# Patient Record
Sex: Female | Born: 1990
Health system: Southern US, Community
[De-identification: ages and names within clinical notes are randomized; demographics above are authoritative.]

## PROBLEM LIST (undated history)

## (undated) ENCOUNTER — Inpatient Hospital Stay (HOSPITAL_COMMUNITY): Payer: Self-pay

## (undated) DIAGNOSIS — O0930 Supervision of pregnancy with insufficient antenatal care, unspecified trimester: Secondary | ICD-10-CM

## (undated) DIAGNOSIS — R87629 Unspecified abnormal cytological findings in specimens from vagina: Secondary | ICD-10-CM

## (undated) DIAGNOSIS — Z789 Other specified health status: Secondary | ICD-10-CM

## (undated) HISTORY — PX: WISDOM TOOTH EXTRACTION: SHX21

## (undated) HISTORY — DX: Supervision of pregnancy with insufficient antenatal care, unspecified trimester: O09.30

## (undated) HISTORY — DX: Unspecified abnormal cytological findings in specimens from vagina: R87.629

## (undated) HISTORY — PX: TONSILLECTOMY: SUR1361

---

## 2014-05-06 ENCOUNTER — Emergency Department (HOSPITAL_BASED_OUTPATIENT_CLINIC_OR_DEPARTMENT_OTHER)
Admission: EM | Admit: 2014-05-06 | Discharge: 2014-05-06 | Disposition: A | Discharge status: Home / Self Care | Payer: BLUE CROSS/BLUE SHIELD | Attending: Emergency Medicine | Admitting: Emergency Medicine

## 2014-05-06 ENCOUNTER — Encounter (HOSPITAL_BASED_OUTPATIENT_CLINIC_OR_DEPARTMENT_OTHER): Payer: Self-pay

## 2014-05-06 DIAGNOSIS — N946 Dysmenorrhea, unspecified: Secondary | ICD-10-CM | POA: Diagnosis not present

## 2014-05-06 DIAGNOSIS — R109 Unspecified abdominal pain: Secondary | ICD-10-CM | POA: Diagnosis present

## 2014-05-06 DIAGNOSIS — Z3202 Encounter for pregnancy test, result negative: Secondary | ICD-10-CM | POA: Insufficient documentation

## 2014-05-06 LAB — URINALYSIS, ROUTINE W REFLEX MICROSCOPIC
Bilirubin Urine: NEGATIVE
GLUCOSE, UA: NEGATIVE mg/dL
KETONES UR: NEGATIVE mg/dL
Leukocytes, UA: NEGATIVE
Nitrite: NEGATIVE
PROTEIN: NEGATIVE mg/dL
Specific Gravity, Urine: 1.026 (ref 1.005–1.030)
UROBILINOGEN UA: 0.2 mg/dL (ref 0.0–1.0)
pH: 7 (ref 5.0–8.0)

## 2014-05-06 LAB — URINE MICROSCOPIC-ADD ON

## 2014-05-06 LAB — PREGNANCY, URINE: PREG TEST UR: NEGATIVE

## 2014-05-06 MED ORDER — KETOROLAC TROMETHAMINE 60 MG/2ML IM SOLN
60.0000 mg | Freq: Once | INTRAMUSCULAR | Status: AC
  Administered 2014-05-06: 60 mg via INTRAMUSCULAR
  Filled 2014-05-06: qty 2

## 2014-05-06 MED ORDER — MEFENAMIC ACID 250 MG PO CAPS: 250.0000 mg | ORAL_CAPSULE | Freq: Four times a day (QID) | ORAL | Status: DC | PRN

## 2014-05-06 NOTE — ED Provider Notes (Signed)
CSN: [id number]     Arrival date & time 05/06/14  1854 History   First MD Initiated Contact with Patient 05/06/14 2041     Chief Complaint  Patient presents with  . Abdominal Pain     (Consider location/radiation/quality/duration/timing/severity/associated sxs/prior Treatment) HPI  24 year old G0 P0 presents today saying that she began having her menstrual cycle yesterday and has been having some lower abdominal cramping since that time. She describes it as severe in nature. Her period is heavier today than it has been in the past area she's describes using a pad every 3 hours today which is the second day of her menstrual cycle. She is on birth control patches and states that her period has been lighter over the past 2 years or she has been on this. She denies any abnormal vaginal discharge, lightheadedness, or history of STDs.  History reviewed. No pertinent past medical history. Past Surgical History  Procedure Laterality Date  . Tonsillectomy     No family history on file. History  Substance Use Topics  . Smoking status: Never Smoker   . Smokeless tobacco: Not on file  . Alcohol Use: No   OB History    No data available     Review of Systems  All other systems reviewed and are negative.     Allergies  Review of patient's allergies indicates no known allergies.  Home Medications   Prior to Admission medications   Medication Sig Start Date End Date Taking? Authorizing Provider  UNKNOWN TO PATIENT    Yes Historical Provider, MD   BP 135/76 mmHg  Pulse 66  Temp(Src) 97.8 F (36.6 C) (Oral)  Resp 18  Ht 5\' 6"  (1.676 m)  Wt 170 lb (77.111 kg)  BMI 27.45 kg/m2  SpO2 100%  LMP 05/06/2014 Physical Exam  Constitutional: She is oriented to person, place, and time. She appears well-developed and well-nourished.  HENT:  Head: Normocephalic and atraumatic.  Right Ear: External ear normal.  Left Ear: External ear normal.  Nose: Nose normal.  Mouth/Throat: Oropharynx  is clear and moist.  Eyes: Conjunctivae and EOM are normal. Pupils are equal, round, and reactive to light.  Neck: Normal range of motion. Neck supple.  Cardiovascular: Normal rate, regular rhythm, normal heart sounds and intact distal pulses.   Pulmonary/Chest: Effort normal and breath sounds normal.  Abdominal: Soft. Bowel sounds are normal.  Musculoskeletal: Normal range of motion.  Neurological: She is alert and oriented to person, place, and time. She has normal reflexes.  Skin: Skin is warm and dry.  Psychiatric: She has a normal mood and affect. Her behavior is normal. Judgment and thought content normal.  Nursing note and vitals reviewed.   ED Course  Procedures (including critical care time) Labs Review Labs Reviewed  URINALYSIS, ROUTINE W REFLEX MICROSCOPIC - Abnormal; Notable for the following:    Hgb urine dipstick LARGE (*)    All other components within normal limits  PREGNANCY, URINE  URINE MICROSCOPIC-ADD ON  Patient is menstruating and did clean catch urine  Imaging Review No results found.   EKG Interpretation None      MDM   Final diagnoses:  Dysmenorrhea        Margarita Grizzleanielle Wade Asebedo, MD 05/06/14 2053

## 2014-05-06 NOTE — Discharge Instructions (Signed)

## 2014-05-06 NOTE — ED Notes (Signed)
Abdominal cramping and back spasms since yesterday unrelieved with Midol.

## 2014-05-06 NOTE — ED Notes (Signed)
C/o abd cramping and back pain x 2 days  Denies urinary sx or vag dc

## 2015-08-28 ENCOUNTER — Emergency Department (HOSPITAL_BASED_OUTPATIENT_CLINIC_OR_DEPARTMENT_OTHER): Payer: BLUE CROSS/BLUE SHIELD

## 2015-08-28 ENCOUNTER — Emergency Department (HOSPITAL_BASED_OUTPATIENT_CLINIC_OR_DEPARTMENT_OTHER)
Admission: EM | Admit: 2015-08-28 | Discharge: 2015-08-28 | Disposition: A | Discharge status: Home / Self Care | Payer: BLUE CROSS/BLUE SHIELD | Attending: Emergency Medicine | Admitting: Emergency Medicine

## 2015-08-28 ENCOUNTER — Encounter (HOSPITAL_BASED_OUTPATIENT_CLINIC_OR_DEPARTMENT_OTHER): Payer: Self-pay | Admitting: Emergency Medicine

## 2015-08-28 DIAGNOSIS — R1031 Right lower quadrant pain: Secondary | ICD-10-CM

## 2015-08-28 DIAGNOSIS — R112 Nausea with vomiting, unspecified: Secondary | ICD-10-CM | POA: Insufficient documentation

## 2015-08-28 LAB — COMPREHENSIVE METABOLIC PANEL
ALBUMIN: 4.3 g/dL (ref 3.5–5.0)
ALT: 36 U/L (ref 14–54)
AST: 34 U/L (ref 15–41)
Alkaline Phosphatase: 50 U/L (ref 38–126)
Anion gap: 9 (ref 5–15)
BUN: 15 mg/dL (ref 6–20)
CO2: 23 mmol/L (ref 22–32)
CREATININE: 0.81 mg/dL (ref 0.44–1.00)
Calcium: 9.1 mg/dL (ref 8.9–10.3)
Chloride: 104 mmol/L (ref 101–111)
GFR calc Af Amer: >60 mL/min (ref >60)
GFR calc non Af Amer: >60 mL/min (ref >60)
GLUCOSE: 82 mg/dL (ref 65–99)
Potassium: 3.5 mmol/L (ref 3.5–5.1)
Sodium: 136 mmol/L (ref 135–145)
TOTAL PROTEIN: 8.1 g/dL (ref 6.5–8.1)
Total Bilirubin: 0.4 mg/dL (ref 0.3–1.2)

## 2015-08-28 LAB — CBC
HCT: 38.4 % (ref 36.0–46.0)
Hemoglobin: 13.2 g/dL (ref 12.0–15.0)
MCH: 29.2 pg (ref 26.0–34.0)
MCHC: 34.4 g/dL (ref 30.0–36.0)
MCV: 85 fL (ref 78.0–100.0)
Platelets: 270 10*3/uL (ref 150–400)
RBC: 4.52 MIL/uL (ref 3.87–5.11)
RDW: 13.7 % (ref 11.5–15.5)
WBC: 5.8 10*3/uL (ref 4.0–10.5)

## 2015-08-28 LAB — URINALYSIS, ROUTINE W REFLEX MICROSCOPIC
Bilirubin Urine: NEGATIVE
Glucose, UA: NEGATIVE mg/dL
Hgb urine dipstick: NEGATIVE
Ketones, ur: NEGATIVE mg/dL
NITRITE: NEGATIVE
Protein, ur: NEGATIVE mg/dL
SPECIFIC GRAVITY, URINE: 1.024 (ref 1.005–1.030)
pH: 6 (ref 5.0–8.0)

## 2015-08-28 LAB — URINE MICROSCOPIC-ADD ON: RBC / HPF: NONE SEEN RBC/hpf (ref 0–5)

## 2015-08-28 LAB — PREGNANCY, URINE: Preg Test, Ur: NEGATIVE

## 2015-08-28 LAB — WET PREP, GENITAL
Trich, Wet Prep: NONE SEEN
YEAST WET PREP: NONE SEEN

## 2015-08-28 LAB — LIPASE, BLOOD: LIPASE: 20 U/L (ref 11–51)

## 2015-08-28 MED ORDER — ONDANSETRON HCL 4 MG/2ML IJ SOLN
4.0000 mg | Freq: Once | INTRAMUSCULAR | Status: AC
  Administered 2015-08-28: 4 mg via INTRAVENOUS
  Filled 2015-08-28: qty 2

## 2015-08-28 MED ORDER — IOPAMIDOL (ISOVUE-300) INJECTION 61%
100.0000 mL | Freq: Once | INTRAVENOUS | Status: AC | PRN
  Administered 2015-08-28: 100 mL via INTRAVENOUS

## 2015-08-28 MED ORDER — SODIUM CHLORIDE 0.9 % IV BOLUS (SEPSIS)
1000.0000 mL | Freq: Once | INTRAVENOUS | Status: AC
  Administered 2015-08-28: 1000 mL via INTRAVENOUS

## 2015-08-28 MED ORDER — METRONIDAZOLE 500 MG PO TABS: 500.0000 mg | ORAL_TABLET | Freq: Two times a day (BID) | ORAL | Status: DC

## 2015-08-28 MED ORDER — ONDANSETRON HCL 4 MG PO TABS: 4.0000 mg | ORAL_TABLET | Freq: Four times a day (QID) | ORAL | Status: DC

## 2015-08-28 NOTE — Discharge Instructions (Signed)
Ms. Kristina PlummerKyeria Ortiz,  Nice meeting you! Please follow-up with your primary care provider. Return to the emergency department if you develop increased abdominal pain, inability to keep foods down, new/worsening symptoms. Feel better soon!  S. Lane HackerNicole Brihanna Devenport, PA-C

## 2015-08-28 NOTE — ED Provider Notes (Signed)
CSN: [id number]     Arrival date & time 08/28/15  1916 History  By signing my name below, I, Soijett Blue, attest that this documentation has been prepared under the direction and in the presence of S. Lane Hacker, PA-C Electronically Signed: Soijett Blue, ED Scribe. 08/28/2015. 7:40 PM.    Chief Complaint  Patient presents with  . Emesis      The history is provided by the patient. No language interpreter was used.    HPI Comments: Kristina Ortiz is a 25 y.o. female who presents to the Emergency Department complaining of vomiting x 2 episodes onset this morning. Pt notes that she had another vomiting episode x 1 while at work today. Pt states that she was using the patch as her contraceptive and she has been off it for 3 weeks. Denies recent abx use, undercooked food, or recent travel. Pt notes that her mother had a stomach virus recently and she has been around her mother. She states that she is having associated symptoms of subjective fever, 5/10 intermittent right lower abdominal pain x 3 days, intermittent diarrhea x 1 week and white vaginal discharge. Pt reports that she spoke with her OB-GYN about her white discharge and was informed that it was nl and there were no concerns at the time. She states that she has not tried any medications for the relief for her symptoms. She denies vaginal bleeding, constipation, nausea, and any other symptoms. Denies abdominal surgeries.   History reviewed. No pertinent past medical history. Past Surgical History  Procedure Laterality Date  . Tonsillectomy     No family history on file. Social History  Substance Use Topics  . Smoking status: Never Smoker   . Smokeless tobacco: None  . Alcohol Use: No   OB History    No data available     Review of Systems  A complete 10 system review of systems was obtained and all systems are negative except as noted in the HPI and PMH.   Allergies  Review of patient's allergies indicates no known  allergies.  Home Medications   Prior to Admission medications   Medication Sig Start Date End Date Taking? Authorizing Provider  Mefenamic Acid 250 MG CAPS Take 1 capsule (250 mg total) by mouth every 6 (six) hours as needed. 05/06/14   Margarita Grizzle, MD  UNKNOWN TO PATIENT     Historical Provider, MD   BP 138/95 mmHg  Pulse 73  Temp(Src) 98.8 F (37.1 C) (Oral)  Resp 18  Ht  (1.676 m)  Wt 170 lb (77.111 kg)  BMI 27.45 kg/m2  SpO2 100%  LMP 08/22/2015 (Approximate) Physical Exam  Constitutional: She is oriented to person, place, and time. She appears well-developed and well-nourished. No distress.  HENT:  Head: Normocephalic and atraumatic.  Eyes: EOM are normal.  Neck: Neck supple.  Cardiovascular: Normal rate.   Pulmonary/Chest: Effort normal. No respiratory distress.  Abdominal: Soft. She exhibits no distension. There is tenderness in the right lower quadrant. There is no CVA tenderness.  RLQ tenderness. +Rosvings sign. No CVA tenderness.   Genitourinary:  Erythematous cervical lesions.   Musculoskeletal: Normal range of motion.  Neurological: She is alert and oriented to person, place, and time.  Skin: Skin is warm and dry.  Psychiatric: She has a normal mood and affect. Her behavior is normal.  Nursing note and vitals reviewed.   ED Course  Procedures (including critical care time) DIAGNOSTIC STUDIES: Oxygen Saturation is 100% on RA, nl by  my interpretation.    COORDINATION OF CARE: 7:38 PM Discussed treatment plan with pt at bedside which includes UA, pelvic exam, CT abdomen pelvis, labs, metronidazole Rx, zofran, and pt agreed to plan.    Labs Review Labs Reviewed  WET PREP, GENITAL - Abnormal; Notable for the following:    Clue Cells Wet Prep HPF POC PRESENT (*)    WBC, Wet Prep HPF POC MANY (*)    All other components within normal limits  URINALYSIS, ROUTINE W REFLEX MICROSCOPIC (NOT AT [facility]) - Abnormal; Notable for the following:    Leukocytes, UA  TRACE (*)    All other components within normal limits  URINE MICROSCOPIC-ADD ON - Abnormal; Notable for the following:    Squamous Epithelial / LPF 0-5 (*)    Bacteria, UA MANY (*)    All other components within normal limits  PREGNANCY, URINE  COMPREHENSIVE METABOLIC PANEL  CBC  LIPASE, BLOOD  RPR  HIV ANTIBODY (ROUTINE TESTING)  GC/CHLAMYDIA PROBE AMP ([place]) NOT AT [facility]    Imaging Review Ct Abdomen Pelvis W Contrast  08/28/2015  CLINICAL DATA:  Diarrhea for 2 days. Right lower quadrant pain and vomiting today. EXAM: CT ABDOMEN AND PELVIS WITH CONTRAST TECHNIQUE: Multidetector CT imaging of the abdomen and pelvis was performed using the standard protocol following bolus administration of intravenous contrast. CONTRAST:  100mL ISOVUE-300 IOPAMIDOL (ISOVUE-300) INJECTION 61% COMPARISON:  None. FINDINGS: Lung bases are clear. The liver, spleen, gallbladder, pancreas, adrenal glands, kidneys, abdominal aorta, inferior vena cava, and retroperitoneal lymph nodes are unremarkable. Stomach, small bowel, and colon are not abnormally distended. Scattered stool in the colon. No free air or free fluid in the abdomen. Pelvis: The appendix is normal. Uterus and ovaries are not enlarged. No free or loculated pelvic fluid collections. No pelvic mass or lymphadenopathy. Bladder wall is not thickened. No destructive bone lesions. IMPRESSION: No acute process demonstrated in the abdomen or pelvis. Normal appendix. No evidence of bowel obstruction or inflammation. Electronically Signed   By: Burman NievesWilliam  Stevens M.D.   On: 08/28/2015 22:00   I have personally reviewed and evaluated these images and lab results as part of my medical decision-making.   MDM   Final diagnoses:  Nausea and vomiting, vomiting of unspecified type  RLQ abdominal pain   Patient nontoxic appearing, VSS. CT abdomen/pelvis unremarkable. Lipase, CBC, CMP unremarkable. BV on wet prep. Sent GC/chlamdyia cultures. Patient does not want  empiric treatment for STI until results are back. Patient may be safely discharged home. Discussed reasons for return. Patient to follow-up with GYN provider for cervical lesions. Patient in understanding and agreement with the plan.  I personally performed the services described in this documentation, which was scribed in my presence. The recorded information has been reviewed and is accurate.   Melton KrebsSamantha Nicole Evelen Vazguez, PA-C 09/11/15 1341  Loren Raceravid Yelverton, MD 09/24/15 [phone]

## 2015-08-28 NOTE — ED Notes (Signed)
Pt c/o diarrhea for the last two days and RLQ pain with vomiting today.  She is tender in RLQ and when palpated in LLQ, she hurts in RLQ.  Pt denies fever, denies sick contacts.  Also has small amount of white vaginal discharge, no concerns for STDs, had a full, 7 day menstrual period on 08/22/15.

## 2015-08-28 NOTE — ED Notes (Signed)
Returned from CT.

## 2015-08-28 NOTE — ED Notes (Signed)
Pt reports stopping birth control x 3 weeks ago.

## 2015-08-28 NOTE — ED Notes (Signed)
Pt c/o vomiting x 3 episodes today and c/o right flank pain.  Pt also reports low grade fever today.

## 2015-08-28 NOTE — ED Notes (Signed)
Patient transported to CT 

## 2015-08-29 LAB — GC/CHLAMYDIA PROBE AMP (~~LOC~~) NOT AT ARMC
Chlamydia: POSITIVE — AB
NEISSERIA GONORRHEA: NEGATIVE

## 2015-08-30 ENCOUNTER — Telehealth (HOSPITAL_BASED_OUTPATIENT_CLINIC_OR_DEPARTMENT_OTHER): Payer: Self-pay | Admitting: Emergency Medicine

## 2015-08-30 LAB — RPR: RPR: NONREACTIVE

## 2015-08-30 LAB — HIV ANTIBODY (ROUTINE TESTING W REFLEX): HIV Screen 4th Generation wRfx: NONREACTIVE

## 2015-08-30 NOTE — Telephone Encounter (Signed)
Chart handoff to EDP for treatment plan for + Chlamydia 

## 2015-09-01 ENCOUNTER — Encounter (HOSPITAL_BASED_OUTPATIENT_CLINIC_OR_DEPARTMENT_OTHER): Payer: Self-pay | Admitting: *Deleted

## 2015-09-01 ENCOUNTER — Telehealth: Payer: Self-pay | Admitting: *Deleted

## 2015-09-01 DIAGNOSIS — N730 Acute parametritis and pelvic cellulitis: Secondary | ICD-10-CM | POA: Diagnosis not present

## 2015-09-01 DIAGNOSIS — R102 Pelvic and perineal pain: Secondary | ICD-10-CM | POA: Diagnosis present

## 2015-09-01 LAB — URINALYSIS, ROUTINE W REFLEX MICROSCOPIC
BILIRUBIN URINE: NEGATIVE
GLUCOSE, UA: NEGATIVE mg/dL
Hgb urine dipstick: NEGATIVE
Ketones, ur: NEGATIVE mg/dL
Leukocytes, UA: NEGATIVE
Nitrite: NEGATIVE
Protein, ur: NEGATIVE mg/dL
Specific Gravity, Urine: 1.019 (ref 1.005–1.030)
pH: 6.5 (ref 5.0–8.0)

## 2015-09-01 LAB — PREGNANCY, URINE: Preg Test, Ur: NEGATIVE

## 2015-09-01 NOTE — Telephone Encounter (Deleted)
Post ED Visit - Positive Culture Follow-up: Successful Patient Follow-Up  Culture assessed and recommendations reviewed by: []  Kristina Ortiz, Pharm.D. []  Kristina Ortiz, Pharm.D., BCPS []  Kristina Ortiz, Pharm.D. []  Kristina Ortiz, [address]Ph[address] BCPS []  Kristina Ortiz, [place] BCPS, AAHIVP []  Kristina Ortiz, Pharm.D., BCPS, AAHIVP []  Kristina Ortiz, Pharm.D. []  Kristina Ortiz, [address]  Positive *** culture  []  Patient discharged without antimicrobial prescription and treatment is now indicated []  Organism is resistant to prescribed ED discharge antimicrobial []  Patient with positive cultures  Changes discussed with ED provider: *** New antibiotic prescription *** Called to ***  Contacted patient, date ***, time ***   Kristina Ortiz, Kristina Ortiz 09/01/2015, 10:59 AM

## 2015-09-01 NOTE — ED Notes (Signed)
Pt reports right sided abdominal pain x 5 days.  Was seen and treated for same on Monday.

## 2015-09-01 NOTE — Telephone Encounter (Signed)
Post ED Visit - Positive Culture Follow-up: Unsuccessful Patient Follow-up  Culture assessed and recommendations reviewed by: []  Kristina Ortiz, Pharm.D. []  Kristina Ortiz, Pharm.D., BCPS []  Kristina Ortiz, Pharm.D. []  Kristina Ortiz, [address] BCPS []  Kristina Ortiz, [place]., BCPS, AAHIVP []  Kristina Ortiz, Pharm.D., BCPS, AAHIVP []  Kristina Ortiz, Pharm.D. []  Kristina Ortiz, [address]  Positive STD culture  [x]  Patient discharged without antimicrobial prescription and treatment is now indicated []  Organism is resistant to prescribed ED discharge antimicrobial []  Patient with positive blood cultures Needs Azithromycin 1gram PO x 1 dose,  Kristina FossaElizabeth Rees, MD  Unable to contact patient after 3 attempts, letter will be sent to address on file  Kristina Ortiz, Kristina Ortiz 09/01/2015, 10:59 AM

## 2015-09-02 ENCOUNTER — Emergency Department (HOSPITAL_BASED_OUTPATIENT_CLINIC_OR_DEPARTMENT_OTHER)
Admission: EM | Admit: 2015-09-02 | Discharge: 2015-09-02 | Disposition: A | Discharge status: Home / Self Care | Payer: BLUE CROSS/BLUE SHIELD | Attending: Emergency Medicine | Admitting: Emergency Medicine

## 2015-09-02 DIAGNOSIS — N730 Acute parametritis and pelvic cellulitis: Secondary | ICD-10-CM

## 2015-09-02 MED ORDER — CEFTRIAXONE SODIUM 250 MG IJ SOLR
250.0000 mg | Freq: Once | INTRAMUSCULAR | Status: AC
  Administered 2015-09-02: 250 mg via INTRAMUSCULAR
  Filled 2015-09-02: qty 250

## 2015-09-02 MED ORDER — IBUPROFEN 800 MG PO TABS
800.0000 mg | ORAL_TABLET | Freq: Once | ORAL | Status: AC
  Administered 2015-09-02: 800 mg via ORAL
  Filled 2015-09-02: qty 1

## 2015-09-02 MED ORDER — DOXYCYCLINE HYCLATE 100 MG PO CAPS: 100.0000 mg | ORAL_CAPSULE | Freq: Two times a day (BID) | ORAL | Status: DC

## 2015-09-02 MED ORDER — NAPROXEN 375 MG PO TABS
375.0000 mg | ORAL_TABLET | Freq: Two times a day (BID) | ORAL | Status: DC
Start: 2015-09-02 — End: 2017-05-23

## 2015-09-02 MED ORDER — AZITHROMYCIN 1 G PO PACK
1.0000 g | PACK | Freq: Once | ORAL | Status: AC
  Administered 2015-09-02: 1 g via ORAL
  Filled 2015-09-02: qty 1

## 2015-09-02 NOTE — Discharge Instructions (Signed)

## 2015-09-02 NOTE — ED Provider Notes (Signed)
CSN: [id number]     Arrival date & time 09/01/15  2308 History   First MD Initiated Contact with Patient 09/02/15 0121     Chief Complaint  Patient presents with  . Abdominal Pain     (Consider location/radiation/quality/duration/timing/severity/associated sxs/prior Treatment) Patient is a 25 y.o. female presenting with abdominal pain. The history is provided by the patient.  Abdominal Pain Pain location: pelvic pain. Pain quality: not shooting   Pain radiates to:  Does not radiate Pain severity:  Severe Onset quality:  Gradual Duration:  6 days Timing:  Constant Progression:  Unchanged Chronicity:  New Context: not retching and not trauma   Relieved by:  Nothing Worsened by:  Nothing tried Ineffective treatments: hydrocodone and flagyl. Associated symptoms: no anorexia, no diarrhea and no vomiting   Associated symptoms comment:  Stool has firmed up on Flagyl Risk factors: not pregnant     History reviewed. No pertinent past medical history. Past Surgical History  Procedure Laterality Date  . Tonsillectomy     History reviewed. No pertinent family history. Social History  Substance Use Topics  . Smoking status: Never Smoker   . Smokeless tobacco: None  . Alcohol Use: No   OB History    No data available     Review of Systems  Gastrointestinal: Positive for abdominal pain. Negative for vomiting, diarrhea and anorexia.  All other systems reviewed and are negative.     Allergies  Review of patient's allergies indicates no known allergies.  Home Medications   Prior to Admission medications   Medication Sig Start Date End Date Taking? Authorizing Provider  Mefenamic Acid 250 MG CAPS Take 1 capsule (250 mg total) by mouth every 6 (six) hours as needed. 05/06/14   Margarita Grizzleanielle Ray, MD  metroNIDAZOLE (FLAGYL) 500 MG tablet Take 1 tablet (500 mg total) by mouth 2 (two) times daily. 08/28/15   Melton KrebsSamantha Nicole Riley, PA-C  ondansetron (ZOFRAN) 4 MG tablet Take 1 tablet (4  mg total) by mouth every 6 (six) hours. 08/28/15   Melton KrebsSamantha Nicole Riley, PA-C  UNKNOWN TO PATIENT     Historical Provider, MD   BP 172/102 mmHg  Pulse 97  Temp(Src) 99.1 F (37.3 C) (Oral)  Resp 20  Ht 5\' 5"  (1.651 m)  Wt 170 lb (77.111 kg)  BMI 28.29 kg/m2  SpO2 98%  LMP 08/22/2015 (Approximate) Physical Exam  Constitutional: She is oriented to person, place, and time. She appears well-developed and well-nourished. No distress.  HENT:  Head: Normocephalic and atraumatic.  Mouth/Throat: Oropharynx is clear and moist.  Eyes: Conjunctivae are normal. Pupils are equal, round, and reactive to light.  Neck: Normal range of motion. Neck supple.  Cardiovascular: Normal rate, regular rhythm and intact distal pulses.   Pulmonary/Chest: Effort normal and breath sounds normal. She has no wheezes. She has no rales.  Abdominal: Soft. Bowel sounds are normal. She exhibits no mass. There is no tenderness. There is no rebound and no guarding.  Musculoskeletal: Normal range of motion.  Neurological: She is alert and oriented to person, place, and time.  Skin: Skin is warm and dry.  Psychiatric: She has a normal mood and affect.    ED Course  Procedures (including critical care time) Labs Review Labs Reviewed  URINALYSIS, ROUTINE W REFLEX MICROSCOPIC (NOT AT [facility])  PREGNANCY, URINE    Imaging Review No results found. I have personally reviewed and evaluated these images and lab results as part of my medical decision-making.   EKG Interpretation None  MDM   Final diagnoses:  None   Filed Vitals:   09/01/15 2331  BP: 172/102  Pulse: 97  Temp: 99.1 F (37.3 C)  Resp: 20   Results for orders placed or performed during the hospital encounter of 09/02/15  Urinalysis, Routine w reflex microscopic (not at [facility])  Result Value Ref Range   Color, Urine YELLOW YELLOW   APPearance CLEAR CLEAR   Specific Gravity, Urine 1.019 1.005 - 1.030   pH 6.5 5.0 - 8.0   Glucose, UA  NEGATIVE NEGATIVE mg/dL   Hgb urine dipstick NEGATIVE NEGATIVE   Bilirubin Urine NEGATIVE NEGATIVE   Ketones, ur NEGATIVE NEGATIVE mg/dL   Protein, ur NEGATIVE NEGATIVE mg/dL   Nitrite NEGATIVE NEGATIVE   Leukocytes, UA NEGATIVE NEGATIVE  Pregnancy, urine  Result Value Ref Range   Preg Test, Ur NEGATIVE NEGATIVE   Ct Abdomen Pelvis W Contrast  08/28/2015  CLINICAL DATA:  Diarrhea for 2 days. Right lower quadrant pain and vomiting today. EXAM: CT ABDOMEN AND PELVIS WITH CONTRAST TECHNIQUE: Multidetector CT imaging of the abdomen and pelvis was performed using the standard protocol following bolus administration of intravenous contrast. CONTRAST:  ISOVUE-300 IOPAMIDOL (ISOVUE-300) INJECTION 61% COMPARISON:  None. FINDINGS: Lung bases are clear. The liver, spleen, gallbladder, pancreas, adrenal glands, kidneys, abdominal aorta, inferior vena cava, and retroperitoneal lymph nodes are unremarkable. Stomach, small bowel, and colon are not abnormally distended. Scattered stool in the colon. No free air or free fluid in the abdomen. Pelvis: The appendix is normal. Uterus and ovaries are not enlarged. No free or loculated pelvic fluid collections. No pelvic mass or lymphadenopathy. Bladder wall is not thickened. No destructive bone lesions. IMPRESSION: No acute process demonstrated in the abdomen or pelvis. Normal appendix. No evidence of bowel obstruction or inflammation. Electronically Signed   By: Burman Nieves M.D.   On: 08/28/2015 22:00   Patient tested positive for chlamydia on pelvic exam on 08/28/15 and was not treated at that time and states no one informed her  Medications  cefTRIAXone (ROCEPHIN) injection 250 mg (not administered)  azithromycin (ZITHROMAX) powder 1 g (not administered)  ibuprofen (ADVIL,MOTRIN) tablet 800 mg (not administered)    I believe the STI is the source of the patient's pelvic pain.   Exam and vitals are benign and reassuring.  Will send home on doxycycline.   Inform all partners as all partners need to be treated.  No sexual activity of any kind until 7 days after all partners are treated.  Follow up with your GYN in 8 days for repeat exam and cultures.  Based on history and exam patient has been appropriately medically screened and emergency conditions excluded. Patient is stable for discharge at this time.   Follow up provided and strict return precautions given.    Cy Blamer, MD 09/02/15 [phone]

## 2015-09-04 ENCOUNTER — Emergency Department (HOSPITAL_BASED_OUTPATIENT_CLINIC_OR_DEPARTMENT_OTHER): Payer: BLUE CROSS/BLUE SHIELD

## 2015-09-04 ENCOUNTER — Emergency Department (HOSPITAL_BASED_OUTPATIENT_CLINIC_OR_DEPARTMENT_OTHER)
Admission: EM | Admit: 2015-09-04 | Discharge: 2015-09-04 | Disposition: A | Discharge status: Home / Self Care | Payer: BLUE CROSS/BLUE SHIELD | Attending: Emergency Medicine | Admitting: Emergency Medicine

## 2015-09-04 ENCOUNTER — Encounter (HOSPITAL_BASED_OUTPATIENT_CLINIC_OR_DEPARTMENT_OTHER): Payer: Self-pay | Admitting: *Deleted

## 2015-09-04 DIAGNOSIS — N730 Acute parametritis and pelvic cellulitis: Secondary | ICD-10-CM | POA: Diagnosis not present

## 2015-09-04 DIAGNOSIS — N739 Female pelvic inflammatory disease, unspecified: Secondary | ICD-10-CM | POA: Insufficient documentation

## 2015-09-04 DIAGNOSIS — R102 Pelvic and perineal pain: Secondary | ICD-10-CM

## 2015-09-04 DIAGNOSIS — R197 Diarrhea, unspecified: Secondary | ICD-10-CM | POA: Insufficient documentation

## 2015-09-04 DIAGNOSIS — R1031 Right lower quadrant pain: Secondary | ICD-10-CM | POA: Diagnosis present

## 2015-09-04 LAB — URINALYSIS, ROUTINE W REFLEX MICROSCOPIC
BILIRUBIN URINE: NEGATIVE
Glucose, UA: NEGATIVE mg/dL
HGB URINE DIPSTICK: NEGATIVE
KETONES UR: NEGATIVE mg/dL
Leukocytes, UA: NEGATIVE
Nitrite: NEGATIVE
PH: 6 (ref 5.0–8.0)
Protein, ur: NEGATIVE mg/dL
Specific Gravity, Urine: 1.021 (ref 1.005–1.030)

## 2015-09-04 LAB — PREGNANCY, URINE: Preg Test, Ur: NEGATIVE

## 2015-09-04 LAB — WET PREP, GENITAL
Sperm: NONE SEEN
TRICH WET PREP: NONE SEEN
Yeast Wet Prep HPF POC: NONE SEEN

## 2015-09-04 MED ORDER — METRONIDAZOLE 500 MG PO TABS: 500.0000 mg | ORAL_TABLET | Freq: Two times a day (BID) | ORAL | Status: DC

## 2015-09-04 MED ORDER — IBUPROFEN 800 MG PO TABS
800.0000 mg | ORAL_TABLET | Freq: Once | ORAL | Status: AC
  Administered 2015-09-04: 800 mg via ORAL
  Filled 2015-09-04: qty 1

## 2015-09-04 NOTE — Discharge Instructions (Signed)
Pelvic Inflammatory Disease °Pelvic inflammatory disease (PID) refers to an infection in some or all of the female organs. The infection can be in the uterus, ovaries, fallopian tubes, or the surrounding tissues in the pelvis. PID can cause abdominal or pelvic pain that comes on suddenly (acute pelvic pain). PID is a serious infection because it can lead to lasting (chronic) pelvic pain or the inability to have children (infertility). °CAUSES °This condition is most often caused by an infection that is spread during sexual contact. However, the infection can also be caused by the normal bacteria that are found in the vaginal tissues if these bacteria travel upward into the reproductive organs. PID can also occur following: °· The birth of a baby. °· A miscarriage. °· An abortion. °· Major pelvic surgery. °· The use of an intrauterine device (IUD). °· A sexual assault. °RISK FACTORS °This condition is more likely to develop in women who: °· Are younger than 23 years of age. °· Are sexually active at a young age. °· Use nonbarrier contraception. °· Have multiple sexual partners. °· Have sex with someone who has symptoms of an STD (sexually transmitted disease). °· Use oral contraception. °At times, certain behaviors can also increase the possibility of getting PID, such as: °· Using a vaginal douche. °· Having an IUD in place. °SYMPTOMS °Symptoms of this condition include: °· Abdominal or pelvic pain. °· Fever. °· Chills. °· Abnormal vaginal discharge. °· Abnormal uterine bleeding. °· Unusual pain shortly after the end of a menstrual period. °· Painful urination. °· Pain with sexual intercourse. °· Nausea and vomiting. °DIAGNOSIS °To diagnose this condition, your health care provider will do a physical exam and take your medical history. A pelvic exam typically reveals great tenderness in the uterus and the surrounding pelvic tissues. You may also have tests, such as: °· Lab tests, including a pregnancy test, blood  tests, and urine test. °· Culture tests of the vagina and cervix to check for an STD. °· Ultrasound. °· A laparoscopic procedure to look inside the pelvis. °· Examining vaginal secretions under a microscope. °TREATMENT °Treatment for this condition may involve one or more approaches. °· Antibiotic medicines may be prescribed to be taken by mouth. °· Sexual partners may need to be treated if the infection is caused by an STD. °· For more severe cases, hospitalization may be needed to give antibiotics directly into a vein through an IV tube. °· Surgery may be needed if other treatments do not help, but this is rare. °It may take weeks until you are completely well. If you are diagnosed with PID, you should also be checked for human immunodeficiency virus (HIV). Your health care provider may test you for infection again 3 months after treatment. You should not have unprotected sex. °HOME CARE INSTRUCTIONS °· Take over-the-counter and prescription medicines only as told by your health care provider. °· If you were prescribed an antibiotic medicine, take it as told by your health care provider. Do not stop taking the antibiotic even if you start to feel better. °· Do not have sexual intercourse until treatment is completed or as told by your health care provider. If PID is confirmed, your recent sexual partners will need treatment, especially if you had unprotected sex. °· Keep all follow-up visits as told by your health care provider. This is important. °SEEK MEDICAL CARE IF: °· You have increased or abnormal vaginal discharge. °· Your pain does not improve. °· You vomit. °· You have a fever. °· You   cannot tolerate your medicines. °· Your partner has an STD. °· You have pain when you urinate. °SEEK IMMEDIATE MEDICAL CARE IF: °· You have increased abdominal or pelvic pain. °· You have chills. °· Your symptoms are not better in 72 hours even with treatment. °  °This information is not intended to replace advice given to  you by your health care provider. Make sure you discuss any questions you have with your health care provider. °  °Document Released: 02/04/2005 Document Revised: 10/26/2014 Document Reviewed: 03/14/2014 °Elsevier Interactive Patient Education ©2016 [employer] ° °Sexually Transmitted Disease °A sexually transmitted disease (STD) is a disease or infection that may be passed (transmitted) from person to person, usually during sexual activity. This may happen by way of saliva, semen, blood, vaginal mucus, or urine. Common STDs include: °· Gonorrhea. °· Chlamydia. °· Syphilis. °· HIV and AIDS. °· Genital herpes. °· Hepatitis B and C. °· Trichomonas. °· Human papillomavirus (HPV). °· Pubic lice. °· Scabies. °· Mites. °· Bacterial vaginosis. °WHAT ARE CAUSES OF STDs? °An STD may be caused by bacteria, a virus, or parasites. STDs are often transmitted during sexual activity if one person is infected. However, they may also be transmitted through nonsexual means. STDs may be transmitted after:  °· Sexual intercourse with an infected person. °· Sharing sex toys with an infected person. °· Sharing needles with an infected person or using unclean piercing or tattoo needles. °· Having intimate contact with the genitals, mouth, or rectal areas of an infected person. °· Exposure to infected fluids during birth. °WHAT ARE THE SIGNS AND SYMPTOMS OF STDs? °Different STDs have different symptoms. Some people may not have any symptoms. If symptoms are present, they may include: °· Painful or bloody urination. °· Pain in the pelvis, abdomen, vagina, anus, throat, or eyes. °· A skin rash, itching, or irritation. °· Growths, ulcerations, blisters, or sores in the genital and anal areas. °· Abnormal vaginal discharge with or without bad odor. °· Penile discharge in men. °· Fever. °· Pain or bleeding during sexual intercourse. °· Swollen glands in the groin area. °· Yellow skin and eyes (jaundice). This is seen with  hepatitis. °· Swollen testicles. °· Infertility. °· Sores and blisters in the mouth. °HOW ARE STDs DIAGNOSED? °To make a diagnosis, your health care provider may: °· Take a medical history. °· Perform a physical exam. °· Take a sample of any discharge to examine. °· Swab the throat, cervix, opening to the penis, rectum, or vagina for testing. °· Test a sample of your first morning urine. °· Perform blood tests. °· Perform a Pap test, if this applies. °· Perform a colposcopy. °· Perform a laparoscopy. °HOW ARE STDs TREATED? °Treatment depends on the STD. Some STDs may be treated but not cured. °· Chlamydia, gonorrhea, trichomonas, and syphilis can be cured with antibiotic medicine. °· Genital herpes, hepatitis, and HIV can be treated, but not cured, with prescribed medicines. The medicines lessen symptoms. °· Genital warts from HPV can be treated with medicine or by freezing, burning (electrocautery), or surgery. Warts may come back. °· HPV cannot be cured with medicine or surgery. However, abnormal areas may be removed from the cervix, vagina, or vulva. °· If your diagnosis is confirmed, your recent sexual partners need treatment. This is true even if they are symptom-free or have a negative culture or evaluation. They should not have sex until their health care providers say it is okay. °· Your health care provider may test you for infection again   3 months after treatment. °HOW CAN I REDUCE MY RISK OF GETTING AN STD? °Take these steps to reduce your risk of getting an STD: °· Use latex condoms, dental dams, and water-soluble lubricants during sexual activity. Do not use petroleum jelly or oils. °· Avoid having multiple sex partners. °· Do not have sex with someone who has other sex partners °· Do not have sex with anyone you do not know or who is at high risk for an STD. °· Avoid risky sex practices that can break your skin. °· Do not have sex if you have open sores on your mouth or skin. °· Avoid drinking too much  alcohol or taking illegal drugs. Alcohol and drugs can affect your judgment and put you in a vulnerable position. °· Avoid engaging in oral and anal sex acts. °· Get vaccinated for HPV and hepatitis. If you have not received these vaccines in the past, talk to your health care provider about whether one or both might be right for you. °· If you are at risk of being infected with HIV, it is recommended that you take a prescription medicine daily to prevent HIV infection. This is called pre-exposure prophylaxis (PrEP). You are considered at risk if: °¨ You are a man who has sex with other men (MSM). °¨ You are a heterosexual man or woman and are sexually active with more than one partner. °¨ You take drugs by injection. °¨ You are sexually active with a partner who has HIV. °· Talk with your health care provider about whether you are at high risk of being infected with HIV. If you choose to begin PrEP, you should first be tested for HIV. You should then be tested every 3 months for as long as you are taking PrEP. °WHAT SHOULD I DO IF I THINK I HAVE AN STD? °· See your health care provider. °· Tell your sexual partner(s). They should be tested and treated for any STDs. °· Do not have sex until your health care provider says it is okay. °WHEN SHOULD I GET IMMEDIATE MEDICAL CARE? °Contact your health care provider right away if:  °· You have severe abdominal pain. °· You are a man and notice swelling or pain in your testicles. °· You are a woman and notice swelling or pain in your vagina. °  °This information is not intended to replace advice given to you by your health care provider. Make sure you discuss any questions you have with your health care provider. °  °Document Released: 04/27/2002 Document Revised: 02/25/2014 Document Reviewed: 08/25/2012 °Elsevier Interactive Patient Education ©2016 [employer] ° °

## 2015-09-04 NOTE — ED Notes (Signed)
Patient is US at this time.

## 2015-09-04 NOTE — ED Notes (Signed)
Right lower quadrant pain x 3 weeks. States she has had multiple exams that show a possible cyst.

## 2015-09-04 NOTE — ED Notes (Addendum)
Patient states this is the 3rd time she has been here for the same RLQ abd pain. Patient states she was told they thought she had a cyst but no one told her that was actually what was wrong. Patient states she was given "some medicine" the last time she was here and that she was taking it but it isn't helping. Chart review shows patient was seen on 08/28/15, CT abd/pelvis was negative at that time, seen again on 09/02/2015. Chart review also shows patient is positive for chlamydia. Support person at bedside. Patient denies any vaginal discharge.

## 2015-09-04 NOTE — ED Provider Notes (Signed)
CSN: [id number]     Arrival date & time 09/04/15  1422 History  By signing my name below, I, Levon HedgerElizabeth Hall, attest that this documentation has been prepared under the direction and in the presence of No att. providers found . Electronically Signed: Levon HedgerElizabeth Hall, Scribe. 09/04/2015. 6:11 PM.   Chief Complaint  Patient presents with  . Abdominal Pain   HPI  HPI Comments:  Kristina Ortiz is a 25 y.o. female who presents to the Emergency Department complaining of worsening, constant, stabbing, RLQ pain onset 3 weeks ago. No alleviating factors noted. She was seen in ED twice last week and was given a pelvic exam on 08/28/15 which was negative at that time, but is positive for chlamydia. She was seen again on 09/02/15 and began abx for chlamydia. Pt states she has taken hydrocodone and ibuprofen with no relief. She also complains of associated diarrhea. Pt denies fever, nausea, vomiting, constipation dysuria, and vaginal discharge.   History reviewed. No pertinent past medical history. Past Surgical History  Procedure Laterality Date  . Tonsillectomy     No family history on file. Social History  Substance Use Topics  . Smoking status: Never Smoker   . Smokeless tobacco: None  . Alcohol Use: No   OB History    No data available     Review of Systems  Constitutional: Negative for fever.  Gastrointestinal: Positive for abdominal pain and diarrhea. Negative for nausea, vomiting and constipation.  Genitourinary: Negative for dysuria and vaginal discharge.  All other systems reviewed and are negative.   Allergies  Review of patient's allergies indicates no known allergies.  Home Medications   Prior to Admission medications   Medication Sig Start Date End Date Taking? Authorizing Provider  doxycycline (VIBRAMYCIN) 100 MG capsule Take 1 capsule (100 mg total) by mouth 2 (two) times daily. One po bid x 14 days 09/02/15   April Palumbo, MD  Mefenamic Acid 250 MG CAPS Take 1 capsule (250 mg  total) by mouth every 6 (six) hours as needed. 05/06/14   Margarita Grizzleanielle Ray, MD  metroNIDAZOLE (FLAGYL) 500 MG tablet Take 1 tablet (500 mg total) by mouth 2 (two) times daily. 08/28/15   Melton KrebsSamantha Nicole Riley, PA-C  metroNIDAZOLE (FLAGYL) 500 MG tablet Take 1 tablet (500 mg total) by mouth 2 (two) times daily. 09/04/15   Tilden FossaElizabeth Vinia Jemmott, MD  naproxen (NAPROSYN) 375 MG tablet Take 1 tablet (375 mg total) by mouth 2 (two) times daily. 09/02/15   April Palumbo, MD  ondansetron (ZOFRAN) 4 MG tablet Take 1 tablet (4 mg total) by mouth every 6 (six) hours. 08/28/15   Melton KrebsSamantha Nicole Riley, PA-C  UNKNOWN TO PATIENT     Historical Provider, MD   BP 138/94 mmHg  Pulse 71  Temp(Src) 99 F (37.2 C) (Oral)  Resp 16  Ht 5' 5.5" (1.664 m)  Wt 170 lb (77.111 kg)  BMI 27.85 kg/m2  SpO2 100%  LMP 08/22/2015 (Approximate) Physical Exam  Constitutional: She is oriented to person, place, and time. She appears well-developed and well-nourished.  HENT:  Head: Normocephalic and atraumatic.  Cardiovascular: Normal rate and regular rhythm.   No murmur heard. Pulmonary/Chest: Effort normal and breath sounds normal. No respiratory distress.  Abdominal: Soft. There is no rebound and no guarding.  Mild RLQ tenderness  Genitourinary:  Cervical friability, small amount of white vaginal discharge.  No CMT.  Mild right adnexal tenderness.  Musculoskeletal: She exhibits no edema or tenderness.  Neurological: She is alert and oriented to  person, place, and time.  Skin: Skin is warm and dry.  Psychiatric: She has a normal mood and affect. Her behavior is normal.  Nursing note and vitals reviewed.   ED Course  Procedures  DIAGNOSTIC STUDIES:  Oxygen Saturation is 98% on RA, normal by my interpretation.    COORDINATION OF CARE:  3:21 PM Will perform Wet prep. Discussed treatment plan with pt at bedside and pt agreed to plan.  Labs Review Labs Reviewed  WET PREP, GENITAL - Abnormal; Notable for the following:    Clue  Cells Wet Prep HPF POC PRESENT (*)    WBC, Wet Prep HPF POC MANY (*)    All other components within normal limits  URINALYSIS, ROUTINE W REFLEX MICROSCOPIC (NOT AT [facility])  PREGNANCY, URINE    Imaging Review US Transvaginal Non-ob  09/04/2015  CLINICAL DATA:  Right adnexal pain for 3 weeks. EXAM: TRANSABDOMINAL AND TRANSVAGINAL ULTRASOUND OF PELVIS TECHNIQUE: Both transabdominal and transvaginal ultrasound examinations of the pelvis were performed. Transabdominal technique was performed for global imaging of the pelvis including uterus, ovaries, adnexal regions, and pelvic cul-de-sac. It was necessary to proceed with endovaginal exam following the transabdominal exam to visualize the ovaries and adnexa. COMPARISON:  CT abdomen/pelvis 08/28/2015 FINDINGS: Uterus Measurements: 6.0 x 3.8 x 5.0 cm, retroverted. No fibroids or other mass visualized. Endometrium Thickness: 7 mm. Minimal fluid in the fundal portion, likely physiologic. No focal abnormality visualized. Right ovary Measurements: 4.5 x 2.6 x 2.0 cm. There is a physiologic corpus luteum. Normal blood flow. No adnexal mass. Left ovary Measurements: 4.5 x 1.4 x 1.5 cm. Normal appearance/no adnexal mass. Normal blood flow. Other findings No abnormal free fluid. Small free fluid in the right adnexa is physiologic. IMPRESSION: Normal pelvic ultrasound. Physiologic corpus luteum in the right ovary with small amount of adjacent free fluid. Electronically Signed   By: Rubye Oaks M.D.   On: 09/04/2015 18:02   US Pelvis Complete  09/04/2015  CLINICAL DATA:  Right adnexal pain for 3 weeks. EXAM: TRANSABDOMINAL AND TRANSVAGINAL ULTRASOUND OF PELVIS TECHNIQUE: Both transabdominal and transvaginal ultrasound examinations of the pelvis were performed. Transabdominal technique was performed for global imaging of the pelvis including uterus, ovaries, adnexal regions, and pelvic cul-de-sac. It was necessary to proceed with endovaginal exam following the  transabdominal exam to visualize the ovaries and adnexa. COMPARISON:  CT abdomen/pelvis 08/28/2015 FINDINGS: Uterus Measurements: 6.0 x 3.8 x 5.0 cm, retroverted. No fibroids or other mass visualized. Endometrium Thickness: 7 mm. Minimal fluid in the fundal portion, likely physiologic. No focal abnormality visualized. Right ovary Measurements: 4.5 x 2.6 x 2.0 cm. There is a physiologic corpus luteum. Normal blood flow. No adnexal mass. Left ovary Measurements: 4.5 x 1.4 x 1.5 cm. Normal appearance/no adnexal mass. Normal blood flow. Other findings No abnormal free fluid. Small free fluid in the right adnexa is physiologic. IMPRESSION: Normal pelvic ultrasound. Physiologic corpus luteum in the right ovary with small amount of adjacent free fluid. Electronically Signed   By: Rubye Oaks M.D.   On: 09/04/2015 18:02   I have personally reviewed and evaluated these images and lab results as part of my medical decision-making.   EKG Interpretation None      MDM   Final diagnoses:  Pelvic pain in female  Acute PID (pelvic inflammatory disease)   Patient with recent diagnosis of pelvic inflammatory disease here with ongoing right lower quadrant pain. Presentation is not consistent with appendicitis, tubo-ovarian abscess. She is already on appropriate antibiotics for  pelvic inflammatory disease which are doxycycline and Flagyl. Will write for additional week of Flagyl. She received her dose of Rocephin in the emergency department on July 15. Offered patient reassurance for pelvic inflammatory disease and home care with naproxen as prescribed, heating pad, rest. Discussed pelvic rest and no intercourse until her treatment course is complete with OB follow-up.  I personally performed the services described in this documentation, which was scribed in my presence. The recorded information has been reviewed and is accurate.    Tilden Fossa, MD 09/04/15 2018

## 2015-09-04 NOTE — ED Notes (Signed)
MD at bedside. 

## 2015-10-31 DIAGNOSIS — Z3200 Encounter for pregnancy test, result unknown: Secondary | ICD-10-CM | POA: Diagnosis not present

## 2015-10-31 DIAGNOSIS — Z01419 Encounter for gynecological examination (general) (routine) without abnormal findings: Secondary | ICD-10-CM | POA: Diagnosis not present

## 2015-10-31 DIAGNOSIS — Z6829 Body mass index (BMI) 29.0-29.9, adult: Secondary | ICD-10-CM | POA: Diagnosis not present

## 2015-11-02 DIAGNOSIS — J029 Acute pharyngitis, unspecified: Secondary | ICD-10-CM | POA: Diagnosis not present

## 2015-11-13 DIAGNOSIS — R51 Headache: Secondary | ICD-10-CM | POA: Diagnosis not present

## 2015-11-14 DIAGNOSIS — Z113 Encounter for screening for infections with a predominantly sexual mode of transmission: Secondary | ICD-10-CM | POA: Diagnosis not present

## 2015-11-14 DIAGNOSIS — R87612 Low grade squamous intraepithelial lesion on cytologic smear of cervix (LGSIL): Secondary | ICD-10-CM | POA: Diagnosis not present

## 2015-11-14 DIAGNOSIS — R87610 Atypical squamous cells of undetermined significance on cytologic smear of cervix (ASC-US): Secondary | ICD-10-CM | POA: Diagnosis not present

## 2015-11-14 DIAGNOSIS — N760 Acute vaginitis: Secondary | ICD-10-CM | POA: Diagnosis not present

## 2015-12-06 ENCOUNTER — Telehealth (HOSPITAL_BASED_OUTPATIENT_CLINIC_OR_DEPARTMENT_OTHER): Payer: Self-pay | Admitting: Emergency Medicine

## 2015-12-06 NOTE — Telephone Encounter (Signed)
Lost to followup, chart appended 

## 2015-12-14 DIAGNOSIS — J069 Acute upper respiratory infection, unspecified: Secondary | ICD-10-CM | POA: Diagnosis not present

## 2015-12-14 DIAGNOSIS — M546 Pain in thoracic spine: Secondary | ICD-10-CM | POA: Diagnosis not present

## 2015-12-18 DIAGNOSIS — R1011 Right upper quadrant pain: Secondary | ICD-10-CM | POA: Diagnosis not present

## 2015-12-18 DIAGNOSIS — R109 Unspecified abdominal pain: Secondary | ICD-10-CM | POA: Diagnosis not present

## 2015-12-19 DIAGNOSIS — R109 Unspecified abdominal pain: Secondary | ICD-10-CM | POA: Diagnosis not present

## 2015-12-19 DIAGNOSIS — R1011 Right upper quadrant pain: Secondary | ICD-10-CM | POA: Diagnosis not present

## 2015-12-20 ENCOUNTER — Encounter (HOSPITAL_BASED_OUTPATIENT_CLINIC_OR_DEPARTMENT_OTHER): Payer: Self-pay | Admitting: Emergency Medicine

## 2015-12-20 ENCOUNTER — Emergency Department (HOSPITAL_BASED_OUTPATIENT_CLINIC_OR_DEPARTMENT_OTHER)
Admission: EM | Admit: 2015-12-20 | Discharge: 2015-12-20 | Disposition: A | Discharge status: Home / Self Care | Payer: BLUE CROSS/BLUE SHIELD | Attending: Emergency Medicine | Admitting: Emergency Medicine

## 2015-12-20 ENCOUNTER — Emergency Department (HOSPITAL_BASED_OUTPATIENT_CLINIC_OR_DEPARTMENT_OTHER): Payer: BLUE CROSS/BLUE SHIELD

## 2015-12-20 DIAGNOSIS — M546 Pain in thoracic spine: Secondary | ICD-10-CM | POA: Insufficient documentation

## 2015-12-20 DIAGNOSIS — R05 Cough: Secondary | ICD-10-CM | POA: Diagnosis not present

## 2015-12-20 DIAGNOSIS — Z79899 Other long term (current) drug therapy: Secondary | ICD-10-CM | POA: Insufficient documentation

## 2015-12-20 DIAGNOSIS — R079 Chest pain, unspecified: Secondary | ICD-10-CM | POA: Diagnosis not present

## 2015-12-20 DIAGNOSIS — R109 Unspecified abdominal pain: Secondary | ICD-10-CM | POA: Insufficient documentation

## 2015-12-20 DIAGNOSIS — M545 Low back pain: Secondary | ICD-10-CM | POA: Insufficient documentation

## 2015-12-20 LAB — CBC WITH DIFFERENTIAL/PLATELET
BASOS PCT: 0 %
Basophils Absolute: 0 10*3/uL (ref 0.0–0.1)
Eosinophils Absolute: 0.1 10*3/uL (ref 0.0–0.7)
Eosinophils Relative: 3 %
HEMATOCRIT: 36.6 % (ref 36.0–46.0)
Hemoglobin: 12.5 g/dL (ref 12.0–15.0)
LYMPHS ABS: 1.9 10*3/uL (ref 0.7–4.0)
Lymphocytes Relative: 38 %
MCH: 28.9 pg (ref 26.0–34.0)
MCHC: 34.2 g/dL (ref 30.0–36.0)
MCV: 84.5 fL (ref 78.0–100.0)
MONO ABS: 0.6 10*3/uL (ref 0.1–1.0)
MONOS PCT: 13 %
NEUTROS ABS: 2.2 10*3/uL (ref 1.7–7.7)
Neutrophils Relative %: 46 %
Platelets: 256 10*3/uL (ref 150–400)
RBC: 4.33 MIL/uL (ref 3.87–5.11)
RDW: 13.7 % (ref 11.5–15.5)
WBC: 4.9 10*3/uL (ref 4.0–10.5)

## 2015-12-20 LAB — COMPREHENSIVE METABOLIC PANEL
ALBUMIN: 3.9 g/dL (ref 3.5–5.0)
ALK PHOS: 46 U/L (ref 38–126)
ALT: 30 U/L (ref 14–54)
ANION GAP: 6 (ref 5–15)
AST: 26 U/L (ref 15–41)
BUN: 13 mg/dL (ref 6–20)
CALCIUM: 9 mg/dL (ref 8.9–10.3)
CHLORIDE: 108 mmol/L (ref 101–111)
CO2: 24 mmol/L (ref 22–32)
Creatinine, Ser: 0.76 mg/dL (ref 0.44–1.00)
GFR calc Af Amer: >60 mL/min (ref >60)
GFR calc non Af Amer: >60 mL/min (ref >60)
GLUCOSE: 112 mg/dL — AB (ref 65–99)
Potassium: 3.6 mmol/L (ref 3.5–5.1)
SODIUM: 138 mmol/L (ref 135–145)
Total Bilirubin: 0.7 mg/dL (ref 0.3–1.2)
Total Protein: 7.4 g/dL (ref 6.5–8.1)

## 2015-12-20 LAB — URINALYSIS, ROUTINE W REFLEX MICROSCOPIC
Bilirubin Urine: NEGATIVE
Glucose, UA: NEGATIVE mg/dL
Hgb urine dipstick: NEGATIVE
KETONES UR: NEGATIVE mg/dL
LEUKOCYTES UA: NEGATIVE
NITRITE: NEGATIVE
PH: 6 (ref 5.0–8.0)
Protein, ur: NEGATIVE mg/dL
SPECIFIC GRAVITY, URINE: 1.03 (ref 1.005–1.030)

## 2015-12-20 LAB — PREGNANCY, URINE: Preg Test, Ur: NEGATIVE

## 2015-12-20 MED ORDER — KETOROLAC TROMETHAMINE 30 MG/ML IJ SOLN
15.0000 mg | Freq: Once | INTRAMUSCULAR | Status: AC
Start: 2015-12-20 — End: 2015-12-20
  Administered 2015-12-20: 15 mg via INTRAVENOUS
  Filled 2015-12-20: qty 1

## 2015-12-20 MED ORDER — CYCLOBENZAPRINE HCL 10 MG PO TABS: 10.0000 mg | ORAL_TABLET | Freq: Two times a day (BID) | ORAL | 0 refills | Status: DC | PRN

## 2015-12-20 NOTE — ED Triage Notes (Addendum)
Pt with rt flank pain x2 weeks. [facility] had ultrasound of gallbladder. Has not received results back. She was told if pain increased to come to the ER.

## 2015-12-20 NOTE — ED Provider Notes (Signed)
[facility] Provider Note   CSN: [id number] Arrival date & time: 12/20/15  [id number]     History   Chief Complaint Chief Complaint  Patient presents with  . Flank Pain    HPI Kristina Ortiz is a 25 y.o. female.  The history is provided by the patient. No language interpreter was used.  Flank Pain     Kristina Ortiz is a 25 y.o. female who presents to the Emergency Department complaining of right flank pain.  She reports 2 weeks of right flank pain. Pain is described as constant and worse with movement. It is nonradiating. She has associated cough and sneeze and 4 days ago she had a fever to 101. She denies any nausea, vomiting, abdominal pain, diarrhea, constipation, dysuria, vaginal discharge. No IV drug use. She went to [facility] and had a gallbladder ultrasound performed but does not have the results back yet. She presents today because her side pain is worsening.  History reviewed. No pertinent past medical history.  There are no active problems to display for this patient.   Past Surgical History:  Procedure Laterality Date  . TONSILLECTOMY      OB History    No data available       Home Medications    Prior to Admission medications   Medication Sig Start Date End Date Taking? Authorizing Provider  cyclobenzaprine (FLEXERIL) 10 MG tablet Take 1 tablet (10 mg total) by mouth 2 (two) times daily as needed for muscle spasms. 12/20/15   Tilden FossaElizabeth Ahmyah Gidley, MD  doxycycline (VIBRAMYCIN) 100 MG capsule Take 1 capsule (100 mg total) by mouth 2 (two) times daily. One po bid x 14 days 09/02/15   April Palumbo, MD  Mefenamic Acid 250 MG CAPS Take 1 capsule (250 mg total) by mouth every 6 (six) hours as needed. 05/06/14   Margarita Grizzleanielle Ray, MD  metroNIDAZOLE (FLAGYL) 500 MG tablet Take 1 tablet (500 mg total) by mouth 2 (two) times daily. 08/28/15   Melton KrebsSamantha Nicole Riley, PA-C  metroNIDAZOLE (FLAGYL) 500 MG tablet Take 1 tablet (500 mg total) by mouth 2 (two) times daily. 09/04/15    Tilden FossaElizabeth Jovonna Nickell, MD  naproxen (NAPROSYN) 375 MG tablet Take 1 tablet (375 mg total) by mouth 2 (two) times daily. 09/02/15   April Palumbo, MD  ondansetron (ZOFRAN) 4 MG tablet Take 1 tablet (4 mg total) by mouth every 6 (six) hours. 08/28/15   Melton KrebsSamantha Nicole Riley, PA-C  UNKNOWN TO PATIENT     Historical Provider, MD    Family History No family history on file.  Social History Social History  Substance Use Topics  . Smoking status: Never Smoker  . Smokeless tobacco: Never Used  . Alcohol use No     Allergies   Review of patient's allergies indicates no known allergies.   Review of Systems Review of Systems  Genitourinary: Positive for flank pain.  All other systems reviewed and are negative.    Physical Exam Updated Vital Signs BP 135/77 (BP Location: Left Arm)   Pulse 70   Temp 97.9 F (36.6 C) (Oral)   Resp 18   Ht 5\' 6"  (1.676 m)   Wt 180 lb (81.6 kg)   LMP 11/19/2015 (Exact Date)   SpO2 98%   BMI 29.05 kg/m   Physical Exam  Constitutional: She is oriented to person, place, and time. She appears well-developed and well-nourished.  HENT:  Head: Normocephalic and atraumatic.  Cardiovascular: Normal rate and regular rhythm.   No murmur heard.  Pulmonary/Chest: Effort normal and breath sounds normal. No respiratory distress.  Abdominal: Soft. There is no tenderness. There is no rebound and no guarding.  Musculoskeletal: She exhibits no edema.  Tenderness over the right lower thoracic and upper lumbar region. No discreet bony tenderness. There is palpable muscle spasm.  Neurological: She is alert and oriented to person, place, and time.  Skin: Skin is warm and dry.  Psychiatric: She has a normal mood and affect. Her behavior is normal.  Nursing note and vitals reviewed.    ED Treatments / Results  Labs (all labs ordered are listed, but only abnormal results are displayed) Labs Reviewed  COMPREHENSIVE METABOLIC PANEL - Abnormal; Notable for the following:         Result Value   Glucose, Bld 112 (*)    All other components within normal limits  URINALYSIS, ROUTINE W REFLEX MICROSCOPIC (NOT [facility])  PREGNANCY, URINE  CBC WITH DIFFERENTIAL/PLATELET    EKG  EKG Interpretation None       Radiology Dg Chest 2 View  Result Date: 12/20/2015 CLINICAL DATA:  Cough and fever for 10 days.  Right-sided chest pain EXAM: CHEST  2 VIEW COMPARISON:  None. FINDINGS: Lungs are clear. Heart size and pulmonary vascularity are normal. No adenopathy. No pneumothorax. No bone lesions. IMPRESSION: No abnormality noted. Electronically Signed   By: Bretta BangWilliam  Woodruff III M.D.   On: 12/20/2015 08:27    Procedures Procedures (including critical care time)  Medications Ordered in ED Medications  ketorolac (TORADOL) 30 MG/ML injection 15 mg (15 mg Intravenous Given 12/20/15 0928)     Initial Impression / Assessment and Plan / ED Course  I have reviewed the triage vital signs and the nursing notes.  Pertinent labs & imaging results that were available during my care of the patient were reviewed by me and considered in my medical decision making (see chart for details).  Clinical Course    Patient here for evaluation of 2 weeks of right flank pain that is reproducible with activity. She has muscle spasm examination without focal tenderness. Presentation is not consistent with cholecystitis, renal colic, UTI, epidural abscess, psoas abscess. Discussed with patient and care for musculoskeletal back pain with NSAIDs. Will provide prescription for Flexeril. Discussed outpatient follow-up and return precautions.  Final Clinical Impressions(s) / ED Diagnoses   Final diagnoses:  Right flank pain    New Prescriptions New Prescriptions   CYCLOBENZAPRINE (FLEXERIL) 10 MG TABLET    Take 1 tablet (10 mg total) by mouth 2 (two) times daily as needed for muscle spasms.     Tilden FossaElizabeth Glenora Morocho, MD 12/20/15 0930

## 2016-02-15 DIAGNOSIS — J0110 Acute frontal sinusitis, unspecified: Secondary | ICD-10-CM | POA: Diagnosis not present

## 2016-02-25 ENCOUNTER — Encounter (HOSPITAL_BASED_OUTPATIENT_CLINIC_OR_DEPARTMENT_OTHER): Payer: Self-pay | Admitting: Emergency Medicine

## 2016-02-25 ENCOUNTER — Emergency Department (HOSPITAL_BASED_OUTPATIENT_CLINIC_OR_DEPARTMENT_OTHER)
Admission: EM | Admit: 2016-02-25 | Discharge: 2016-02-25 | Disposition: A | Discharge status: Home / Self Care | Payer: BLUE CROSS/BLUE SHIELD | Attending: Emergency Medicine | Admitting: Emergency Medicine

## 2016-02-25 ENCOUNTER — Emergency Department (HOSPITAL_BASED_OUTPATIENT_CLINIC_OR_DEPARTMENT_OTHER): Payer: BLUE CROSS/BLUE SHIELD

## 2016-02-25 DIAGNOSIS — Z79899 Other long term (current) drug therapy: Secondary | ICD-10-CM | POA: Diagnosis not present

## 2016-02-25 DIAGNOSIS — J069 Acute upper respiratory infection, unspecified: Secondary | ICD-10-CM | POA: Diagnosis not present

## 2016-02-25 DIAGNOSIS — B9789 Other viral agents as the cause of diseases classified elsewhere: Secondary | ICD-10-CM

## 2016-02-25 DIAGNOSIS — R05 Cough: Secondary | ICD-10-CM | POA: Diagnosis not present

## 2016-02-25 MED ORDER — PROMETHAZINE-DM 6.25-15 MG/5ML PO SYRP: 5.0000 mL | ORAL_SOLUTION | Freq: Four times a day (QID) | ORAL | 0 refills | Status: DC | PRN

## 2016-02-25 MED ORDER — BENZONATATE 100 MG PO CAPS: 100.0000 mg | ORAL_CAPSULE | Freq: Three times a day (TID) | ORAL | 0 refills | Status: DC

## 2016-02-25 NOTE — ED Triage Notes (Signed)
Cough, fever, congestion since last week . This am has had a cough that causes her to vomit. Was seen last week at [facility] and dx with virus but not feeling any better.

## 2016-02-25 NOTE — ED Provider Notes (Signed)
[facility]   CSN: [id number] Arrival date & time: 02/25/16  1200     History   Chief Complaint Chief Complaint  Patient presents with  . Cough    HPI Kristina Ortiz is a 26 y.o. female.  HPI   26 year old female presents with cold symptoms. Patient report for the past week she has had sinus congestion, ear discomfort, sneezing, cough productive with white phlegm, throat irritation and decreased appetite. 5 days ago she was seen at urgent care for her symptoms and was giving an ocean spray which she has been using with minimal improvement. She continues to endorse the same symptoms without adequate relief. States she has subjective fever at home. Endorse occasional diarrhea. Denies any recent sick contact. She is nonsmoker and no recent travel.  History reviewed. No pertinent past medical history.  There are no active problems to display for this patient.   Past Surgical History:  Procedure Laterality Date  . TONSILLECTOMY      OB History    No data available       Home Medications    Prior to Admission medications   Medication Sig Start Date End Date Taking? Authorizing Provider  cyclobenzaprine (FLEXERIL) 10 MG tablet Take 1 tablet (10 mg total) by mouth 2 (two) times daily as needed for muscle spasms. 12/20/15   Tilden FossaElizabeth Rees, MD  doxycycline (VIBRAMYCIN) 100 MG capsule Take 1 capsule (100 mg total) by mouth 2 (two) times daily. One po bid x 14 days 09/02/15   April Palumbo, MD  Mefenamic Acid 250 MG CAPS Take 1 capsule (250 mg total) by mouth every 6 (six) hours as needed. 05/06/14   Margarita Grizzleanielle Ray, MD  metroNIDAZOLE (FLAGYL) 500 MG tablet Take 1 tablet (500 mg total) by mouth 2 (two) times daily. 08/28/15   Melton KrebsSamantha Nicole Riley, PA-C  metroNIDAZOLE (FLAGYL) 500 MG tablet Take 1 tablet (500 mg total) by mouth 2 (two) times daily. 09/04/15   Tilden FossaElizabeth Rees, MD  naproxen (NAPROSYN) 375 MG tablet Take 1 tablet (375 mg total) by mouth 2 (two) times  daily. 09/02/15   April Palumbo, MD  ondansetron (ZOFRAN) 4 MG tablet Take 1 tablet (4 mg total) by mouth every 6 (six) hours. 08/28/15   Melton KrebsSamantha Nicole Riley, PA-C  UNKNOWN TO PATIENT     Historical Provider, MD    Family History No family history on file.  Social History Social History  Substance Use Topics  . Smoking status: Never Smoker  . Smokeless tobacco: Never Used  . Alcohol use Yes     Comment: socially     Allergies   Cinnamon   Review of Systems Review of Systems  All other systems reviewed and are negative.    Physical Exam Updated Vital Signs BP 135/80 (BP Location: Right Arm)   Pulse 81   Temp 98.2 F (36.8 C) (Oral)   Resp 20   Ht 5\' 6"  (1.676 m)   Wt 81.6 kg   LMP 02/12/2015   SpO2 98%   BMI 29.05 kg/m   Physical Exam  Constitutional: She is oriented to person, place, and time. She appears well-developed and well-nourished. No distress.  HENT:  Head: Atraumatic.  Right Ear: External ear normal.  Left Ear: External ear normal.  Nose: Nose normal.  Mouth/Throat: Oropharynx is clear and moist.  Eyes: Conjunctivae are normal.  Neck: Normal range of motion. Neck supple.  Cardiovascular: Normal rate and regular rhythm.   Pulmonary/Chest: Effort normal and breath sounds  normal.  Abdominal: Soft. There is no tenderness.  Lymphadenopathy:    She has no cervical adenopathy.  Neurological: She is alert and oriented to person, place, and time.  Skin: No rash noted.  Psychiatric: She has a normal mood and affect.  Nursing note and vitals reviewed.    ED Treatments / Results  Labs (all labs ordered are listed, but only abnormal results are displayed) Labs Reviewed - No data to display  EKG  EKG Interpretation None       Radiology Dg Chest 2 View  Result Date: 02/25/2016 CLINICAL DATA:  Productive cough and fever for 1 week EXAM: CHEST  2 VIEW COMPARISON:  12/20/2015 FINDINGS: The heart size and mediastinal contours are within normal  limits. Both lungs are clear. The visualized skeletal structures are unremarkable. IMPRESSION: No active cardiopulmonary disease. Electronically Signed   By: Alcide Clever M.D.   On: 02/25/2016 12:37    Procedures Procedures (including critical care time)  Medications Ordered in ED Medications - No data to display   Initial Impression / Assessment and Plan / ED Course  I have reviewed the triage vital signs and the nursing notes.  Pertinent labs & imaging results that were available during my care of the patient were reviewed by me and considered in my medical decision making (see chart for details).  Clinical Course     BP 135/80 (BP Location: Right Arm)   Pulse 81   Temp 98.2 F (36.8 C) (Oral)   Resp 20   Ht 5\' 6"  (1.676 m)   Wt 81.6 kg   LMP 02/12/2015   SpO2 98%   BMI 29.05 kg/m    Final Clinical Impressions(s) / ED Diagnoses   Final diagnoses:  Viral URI with cough    New Prescriptions New Prescriptions   BENZONATATE (TESSALON) 100 MG CAPSULE    Take 1 capsule (100 mg total) by mouth every 8 (eight) hours.   PROMETHAZINE-DEXTROMETHORPHAN (PROMETHAZINE-DM) 6.25-15 MG/5ML SYRUP    Take 5 mLs by mouth 4 (four) times daily as needed.  ` 1:34 PM Patient here with symptoms suggestive of viral respiratory infection. Chest x-ray without pneumonia. I discussed with patient about the symptoms and explained the expected course of infection which may last 10-14 days. Will provide symptomatic treatment. Return precaution discussed. She is well-appearing and stable for discharge.   Fayrene Helper, PA-C 02/25/16 1334    Lyndal Pulley, MD 02/25/16 [phone]

## 2016-02-28 ENCOUNTER — Emergency Department (HOSPITAL_BASED_OUTPATIENT_CLINIC_OR_DEPARTMENT_OTHER)
Admission: EM | Admit: 2016-02-28 | Discharge: 2016-02-28 | Disposition: A | Discharge status: Home / Self Care | Payer: BLUE CROSS/BLUE SHIELD | Attending: Emergency Medicine | Admitting: Emergency Medicine

## 2016-02-28 ENCOUNTER — Encounter (HOSPITAL_BASED_OUTPATIENT_CLINIC_OR_DEPARTMENT_OTHER): Payer: Self-pay | Admitting: *Deleted

## 2016-02-28 DIAGNOSIS — R05 Cough: Secondary | ICD-10-CM | POA: Diagnosis not present

## 2016-02-28 DIAGNOSIS — R059 Cough, unspecified: Secondary | ICD-10-CM

## 2016-02-28 DIAGNOSIS — J0110 Acute frontal sinusitis, unspecified: Secondary | ICD-10-CM

## 2016-02-28 MED ORDER — AMOXICILLIN-POT CLAVULANATE 875-125 MG PO TABS: 1.0000 | ORAL_TABLET | Freq: Two times a day (BID) | ORAL | 0 refills | Status: DC

## 2016-02-28 MED ORDER — AZITHROMYCIN 250 MG PO TABS: 250.0000 mg | ORAL_TABLET | Freq: Every day | ORAL | 0 refills | Status: DC

## 2016-02-28 NOTE — ED Triage Notes (Addendum)
Pt c/o URi symptoms x 3 days seen here 3 days ago  for same and no improvement

## 2016-02-28 NOTE — Discharge Instructions (Signed)
Your symptoms are consistent with a viral illness. Viruses do not require antibiotics, but antibiotics may be a good idea at this point due to the duration and intensity of symptoms, indicating that a bacterial infection may have established itself. Treatment is symptomatic care and it is important to note that these symptoms may last for 7-10 days or more. Drink plenty of fluids and get plenty of rest. You should be drinking at least half a liter of water an hour to stay hydrated. Electrolyte drinks are also encouraged. Ibuprofen, Naproxen, or Tylenol for pain or fever. Tessalon for cough. Plain Mucinex or Sudafed may help relieve congestion. Warm liquids or Chloraseptic spray may help soothe a sore throat. Follow up with a primary care provider, as needed, for any future management of this issue. Please take all of your antibiotics until finished!   You may develop abdominal discomfort or diarrhea from the antibiotic.  You may help offset this with probiotics which you can buy or get in yogurt. Do not eat or take the probiotics until 2 hours after your antibiotic.

## 2016-02-28 NOTE — ED Notes (Signed)
ED Provider at bedside. 

## 2016-02-28 NOTE — ED Provider Notes (Signed)
[facility]   CSN: [id number] Arrival date & time: 02/28/16  1654  By signing my name below, I, [name], attest that this documentation has been prepared under the direction and in the presence of non-physician practitioner, Harolyn RutherfordShawn Serge Main, PA-C. Electronically Signed: [name], Scribe. 02/28/2016. 7:00 PM.  History   Chief Complaint Chief Complaint  Patient presents with  . URI   The history is provided by the patient. No language interpreter was used.   HPI Comments: Kristina PlummerKyeria Ortiz is a 26 y.o. female who presents to the Emergency Department complaining of intermittent moderate productive cough that started over a week ago. She came to the ED 2 days ago for the same complaint. She was sent home with Promethazine DM syrup and Tessalon Pearls with no relief. She reports associated symptoms of sinus congestion and pain, post-tussive vomiting, subjective fever, and chest discomfort with coughing and deep breathing. Patient is concerned because her symptoms have not resolved. Patient denies shortness of breath, abdominal pain, or any other complaints.    History reviewed. No pertinent past medical history.  There are no active problems to display for this patient.   Past Surgical History:  Procedure Laterality Date  . TONSILLECTOMY      OB History    No data available       Home Medications    Prior to Admission medications   Medication Sig Start Date End Date Taking? Authorizing Provider  azithromycin (ZITHROMAX) 250 MG tablet Take 1 tablet (250 mg total) by mouth daily. Take first 2 tablets together, then 1 every day until finished. 02/28/16   Meridian Scherger C Germany Chelf, PA-C  benzonatate (TESSALON) 100 MG capsule Take 1 capsule (100 mg total) by mouth every 8 (eight) hours. 02/25/16   Fayrene HelperBowie Tran, PA-C  cyclobenzaprine (FLEXERIL) 10 MG tablet Take 1 tablet (10 mg total) by mouth 2 (two) times daily as needed for muscle spasms. 12/20/15   Tilden FossaElizabeth Rees, MD    doxycycline (VIBRAMYCIN) 100 MG capsule Take 1 capsule (100 mg total) by mouth 2 (two) times daily. One po bid x 14 days 09/02/15   April Palumbo, MD  Mefenamic Acid 250 MG CAPS Take 1 capsule (250 mg total) by mouth every 6 (six) hours as needed. 05/06/14   Margarita Grizzleanielle Ray, MD  metroNIDAZOLE (FLAGYL) 500 MG tablet Take 1 tablet (500 mg total) by mouth 2 (two) times daily. 08/28/15   Melton KrebsSamantha Nicole Riley, PA-C  metroNIDAZOLE (FLAGYL) 500 MG tablet Take 1 tablet (500 mg total) by mouth 2 (two) times daily. 09/04/15   Tilden FossaElizabeth Rees, MD  naproxen (NAPROSYN) 375 MG tablet Take 1 tablet (375 mg total) by mouth 2 (two) times daily. 09/02/15   April Palumbo, MD  ondansetron (ZOFRAN) 4 MG tablet Take 1 tablet (4 mg total) by mouth every 6 (six) hours. 08/28/15   Melton KrebsSamantha Nicole Riley, PA-C  promethazine-dextromethorphan (PROMETHAZINE-DM) 6.25-15 MG/5ML syrup Take 5 mLs by mouth 4 (four) times daily as needed. 02/25/16   Fayrene HelperBowie Tran, PA-C  UNKNOWN TO PATIENT     Historical Provider, MD    Family History History reviewed. No pertinent family history.  Social History Social History  Substance Use Topics  . Smoking status: Never Smoker  . Smokeless tobacco: Never Used  . Alcohol use Yes     Comment: socially     Allergies   Cinnamon   Review of Systems Review of Systems  Constitutional: Positive for fever (Subjective).  HENT: Positive for sinus pain and sinus pressure.  Respiratory: Positive for cough. Negative for shortness of breath.   Gastrointestinal: Positive for vomiting (Post-tussive). Negative for abdominal pain and nausea.  All other systems reviewed and are negative.    Physical Exam Updated Vital Signs BP 161/93 (BP Location: Right Arm)   Pulse 93   Temp 97.9 F (36.6 C) (Oral)   Resp 16   Ht 5\' 6"  (1.676 m)   Wt 180 lb (81.6 kg)   LMP 02/12/2015   SpO2 98%   BMI 29.05 kg/m   Physical Exam  Constitutional: She appears well-developed and well-nourished. No distress.  HENT:   Head: Normocephalic and atraumatic.  Tenderness over the frontal sinuses bilaterally.  Eyes: Conjunctivae are normal.  Neck: Neck supple.  Cardiovascular: Normal rate, regular rhythm, normal heart sounds and intact distal pulses.   Pulmonary/Chest: Effort normal and breath sounds normal. No respiratory distress.  Abdominal: Soft. There is no tenderness. There is no guarding.  Musculoskeletal: She exhibits no edema.  Lymphadenopathy:    She has no cervical adenopathy.  Neurological: She is alert.  Skin: Skin is warm and dry. She is not diaphoretic.  Psychiatric: She has a normal mood and affect. Her behavior is normal.  Nursing note and vitals reviewed.    ED Treatments / Results  DIAGNOSTIC STUDIES: Oxygen Saturation is 98% on RA, normal by my interpretation.    COORDINATION OF CARE: 7:05 PM- Pt advised of plan for treatment and pt agrees.  Labs (all labs ordered are listed, but only abnormal results are displayed) Labs Reviewed - No data to display  EKG  EKG Interpretation None       Radiology No results found.  Procedures Procedures (including critical care time)  Medications Ordered in ED Medications - No data to display   Initial Impression / Assessment and Plan / ED Course  I have reviewed the triage vital signs and the nursing notes.  Pertinent labs & imaging results that were available during my care of the patient were reviewed by me and considered in my medical decision making (see chart for details).  Clinical Course     Patient presents with symptoms of URI and sinusitis. Antibiotic therapy due to duration of symptoms. PCP follow-up. Return precautions discussed.    Final Clinical Impressions(s) / ED Diagnoses   Final diagnoses:  Acute frontal sinusitis, recurrence not specified  Cough    New Prescriptions New Prescriptions   AZITHROMYCIN (ZITHROMAX) 250 MG TABLET    Take 1 tablet (250 mg total) by mouth daily. Take first 2 tablets  together, then 1 every day until finished.   I personally performed the services described in this documentation, which was scribed in my presence. The recorded information has been reviewed and is accurate.    Anselm Pancoast, PA-C 03/02/16 0305    Marily Memos, MD 03/04/16 [phone]

## 2016-06-17 DIAGNOSIS — M25562 Pain in left knee: Secondary | ICD-10-CM | POA: Diagnosis not present

## 2016-06-17 DIAGNOSIS — Z7721 Contact with and (suspected) exposure to potentially hazardous body fluids: Secondary | ICD-10-CM | POA: Diagnosis not present

## 2016-06-17 DIAGNOSIS — M25462 Effusion, left knee: Secondary | ICD-10-CM | POA: Diagnosis not present

## 2016-06-17 DIAGNOSIS — S8002XA Contusion of left knee, initial encounter: Secondary | ICD-10-CM | POA: Diagnosis not present

## 2016-06-18 DIAGNOSIS — M25462 Effusion, left knee: Secondary | ICD-10-CM | POA: Diagnosis not present

## 2016-08-09 DIAGNOSIS — A749 Chlamydial infection, unspecified: Secondary | ICD-10-CM | POA: Diagnosis not present

## 2016-08-19 DIAGNOSIS — J301 Allergic rhinitis due to pollen: Secondary | ICD-10-CM | POA: Diagnosis not present

## 2016-08-20 DIAGNOSIS — J301 Allergic rhinitis due to pollen: Secondary | ICD-10-CM | POA: Insufficient documentation

## 2016-10-04 DIAGNOSIS — M79604 Pain in right leg: Secondary | ICD-10-CM | POA: Diagnosis not present

## 2016-10-04 DIAGNOSIS — M79605 Pain in left leg: Secondary | ICD-10-CM | POA: Diagnosis not present

## 2016-10-04 DIAGNOSIS — J321 Chronic frontal sinusitis: Secondary | ICD-10-CM | POA: Diagnosis not present

## 2016-10-11 DIAGNOSIS — J0100 Acute maxillary sinusitis, unspecified: Secondary | ICD-10-CM | POA: Diagnosis not present

## 2016-11-18 ENCOUNTER — Encounter (HOSPITAL_BASED_OUTPATIENT_CLINIC_OR_DEPARTMENT_OTHER): Payer: Self-pay | Admitting: Emergency Medicine

## 2016-11-18 ENCOUNTER — Emergency Department (HOSPITAL_BASED_OUTPATIENT_CLINIC_OR_DEPARTMENT_OTHER)
Admission: EM | Admit: 2016-11-18 | Discharge: 2016-11-18 | Disposition: A | Discharge status: Home / Self Care | Payer: BLUE CROSS/BLUE SHIELD | Attending: Emergency Medicine | Admitting: Emergency Medicine

## 2016-11-18 DIAGNOSIS — B349 Viral infection, unspecified: Secondary | ICD-10-CM | POA: Insufficient documentation

## 2016-11-18 DIAGNOSIS — R05 Cough: Secondary | ICD-10-CM | POA: Diagnosis not present

## 2016-11-18 DIAGNOSIS — Z79899 Other long term (current) drug therapy: Secondary | ICD-10-CM | POA: Diagnosis not present

## 2016-11-18 DIAGNOSIS — B9789 Other viral agents as the cause of diseases classified elsewhere: Secondary | ICD-10-CM

## 2016-11-18 DIAGNOSIS — J069 Acute upper respiratory infection, unspecified: Secondary | ICD-10-CM | POA: Insufficient documentation

## 2016-11-18 DIAGNOSIS — Z791 Long term (current) use of non-steroidal anti-inflammatories (NSAID): Secondary | ICD-10-CM | POA: Insufficient documentation

## 2016-11-18 MED ORDER — BENZONATATE 100 MG PO CAPS
100.0000 mg | ORAL_CAPSULE | Freq: Once | ORAL | Status: AC
  Administered 2016-11-18: 100 mg via ORAL
  Filled 2016-11-18: qty 1

## 2016-11-18 MED ORDER — PSEUDOEPHEDRINE HCL ER 120 MG PO TB12: 120.0000 mg | ORAL_TABLET | Freq: Two times a day (BID) | ORAL | 0 refills | Status: DC

## 2016-11-18 MED ORDER — BENZONATATE 100 MG PO CAPS: 100.0000 mg | ORAL_CAPSULE | Freq: Three times a day (TID) | ORAL | 0 refills | Status: DC

## 2016-11-18 NOTE — ED Notes (Signed)
Pt c/o cold symptoms for almost a week, subjective fever at home, congestion and post tussive emesis

## 2016-11-18 NOTE — Discharge Instructions (Signed)
Please read and follow all provided instructions.  Your diagnoses today include:  1. Viral URI with cough     You appear to have an upper respiratory infection (URI). An upper respiratory tract infection, or cold, is a viral infection of the air passages leading to the lungs. It should improve gradually after 5-7 days. You may have a lingering cough that lasts for 2- 4 weeks after the infection.  Tests performed today include:  Vital signs. See below for your results today.   Medications prescribed:   Tessalon Perles - cough suppressant medication   Pseudoephedrine - decongestant medication to help with nasal congestion  Take any prescribed medications only as directed. Treatment for your infection is aimed at treating the symptoms. There are no medications, such as antibiotics, that will cure your infection.   Home care instructions:  Follow any educational materials contained in this packet.   Your illness is contagious and can be spread to others, especially during the first 3 or 4 days. It cannot be cured by antibiotics or other medicines. Take basic precautions such as washing your hands often, covering your mouth when you cough or sneeze, and avoiding public places where you could spread your illness to others.   Please continue drinking plenty of fluids.  Use over-the-counter medicines as needed as directed on packaging for symptom relief.  You may also use ibuprofen or tylenol as directed on packaging for pain or fever.  Do not take multiple medicines containing Tylenol or acetaminophen to avoid taking too much of this medication.  Follow-up instructions: Please follow-up with your primary care provider in the next 3 days for further evaluation of your symptoms if you are not feeling better.   Return instructions:   Please return to the Emergency Department if you experience worsening symptoms.   RETURN IMMEDIATELY IF you develop shortness of breath, confusion or altered  mental status, a new rash, become dizzy, faint, or poorly responsive, or are unable to be cared for at home.  Please return if you have persistent vomiting and cannot keep down fluids or develop a fever that is not controlled by tylenol or motrin.    Please return if you have any other emergent concerns.  Additional Information:  Your vital signs today were: BP (!) 147/96 (BP Location: Left Arm)    Pulse 83    Temp 98.3 F (36.8 C) (Oral)    Resp 18    Ht  (1.676 m)    Wt 87.5 kg (193 lb)    LMP 10/19/2016    SpO2 100%    BMI 31.15 kg/m  If your blood pressure (BP) was elevated above 135/85 this visit, please have this repeated by your doctor within one month. --------------

## 2016-11-18 NOTE — ED Provider Notes (Signed)
[facility] Provider Note   CSN: [id number] Arrival date & time: 11/18/16  2246     History   Chief Complaint Chief Complaint  Patient presents with  . Cough    HPI Kristina Ortiz is a 26 y.o. female.  Patient presents with complaint of nasal congestion, sore throat, cough with posttussive emesis, chest pain with coughing over the past 7 days. Patient saw her primary care physician and had a negative flu test and strep test. She was prescribed Phenergan for her vomiting. Symptoms have persisted. She has not had any fevers, ear pain, shortness of breath, wheezing, abdominal pains, urinary symptoms, skin rashes. She has not been using any over-the-counter medications. She denies any sick contacts. The onset of this condition was acute. The course is constant. Aggravating factors: none. Alleviating factors: none.        History reviewed. No pertinent past medical history.  There are no active problems to display for this patient.   Past Surgical History:  Procedure Laterality Date  . TONSILLECTOMY      OB History    No data available       Home Medications    Prior to Admission medications   Medication Sig Start Date End Date Taking? Authorizing Provider  amoxicillin-clavulanate (AUGMENTIN) 875-125 MG tablet Take 1 tablet by mouth every 12 (twelve) hours. 02/28/16   Joy, Shawn C, PA-C  benzonatate (TESSALON) 100 MG capsule Take 1 capsule (100 mg total) by mouth every 8 (eight) hours. 11/18/16   Renne Crigler, PA-C  cyclobenzaprine (FLEXERIL) 10 MG tablet Take 1 tablet (10 mg total) by mouth 2 (two) times daily as needed for muscle spasms. 12/20/15   Tilden Fossa, MD  doxycycline (VIBRAMYCIN) 100 MG capsule Take 1 capsule (100 mg total) by mouth 2 (two) times daily. One po bid x 14 days 09/02/15   Palumbo, April, MD  Mefenamic Acid 250 MG CAPS Take 1 capsule (250 mg total) by mouth every 6 (six) hours as needed. 05/06/14   Margarita Grizzle, MD  metroNIDAZOLE  (FLAGYL) 500 MG tablet Take 1 tablet (500 mg total) by mouth 2 (two) times daily. 08/28/15   Melton Krebs, PA-C  metroNIDAZOLE (FLAGYL) 500 MG tablet Take 1 tablet (500 mg total) by mouth 2 (two) times daily. 09/04/15   Tilden Fossa, MD  naproxen (NAPROSYN) 375 MG tablet Take 1 tablet (375 mg total) by mouth 2 (two) times daily. 09/02/15   Palumbo, April, MD  ondansetron (ZOFRAN) 4 MG tablet Take 1 tablet (4 mg total) by mouth every 6 (six) hours. 08/28/15   Melton Krebs, PA-C  promethazine-dextromethorphan (PROMETHAZINE-DM) 6.25-15 MG/5ML syrup Take 5 mLs by mouth 4 (four) times daily as needed. 02/25/16   Fayrene Helper, PA-C  pseudoephedrine (SUDAFED 12 HOUR) 120 MG 12 hr tablet Take 1 tablet (120 mg total) by mouth 2 (two) times daily. 11/18/16   Renne Crigler, PA-C  UNKNOWN TO PATIENT     [provider]    Family History History reviewed. No pertinent family history.  Social History Social History  Substance Use Topics  . Smoking status: Never Smoker  . Smokeless tobacco: Never Used  . Alcohol use Yes     Comment: socially     Allergies   Cinnamon   Review of Systems Review of Systems  Constitutional: Negative for chills, fatigue and fever.  HENT: Positive for congestion, sore throat and voice change. Negative for ear pain, rhinorrhea and sinus pressure.   Eyes: Negative for redness.  Respiratory: Positive for cough. Negative for wheezing.   Gastrointestinal: Positive for vomiting. Negative for abdominal pain, diarrhea and nausea.  Genitourinary: Negative for dysuria.  Musculoskeletal: Negative for myalgias and neck stiffness.  Skin: Negative for rash.  Neurological: Negative for headaches.  Hematological: Negative for adenopathy.     Physical Exam Updated Vital Signs BP (!) 147/96 (BP Location: Left Arm)   Pulse 83   Temp 98.3 F (36.8 C) (Oral)   Resp 18   Ht  (1.676 m)   Wt 87.5 kg (193 lb)   LMP 10/19/2016   SpO2 100%   BMI  31.15 kg/m   Physical Exam  Constitutional: She appears well-developed and well-nourished.  HENT:  Head: Normocephalic and atraumatic.  Right Ear: Tympanic membrane, external ear and ear canal normal.  Left Ear: Tympanic membrane, external ear and ear canal normal.  Nose: Mucosal edema present. No rhinorrhea.  Mouth/Throat: Uvula is midline and mucous membranes are normal. Mucous membranes are not dry. No oral lesions. No trismus in the jaw. No uvula swelling. No oropharyngeal exudate, posterior oropharyngeal edema, posterior oropharyngeal erythema or tonsillar abscesses.  Eyes: Conjunctivae are normal. Right eye exhibits no discharge. Left eye exhibits no discharge.  Neck: Normal range of motion. Neck supple.  Cardiovascular: Normal rate, regular rhythm and normal heart sounds.   Pulmonary/Chest: Effort normal and breath sounds normal. No respiratory distress. She has no wheezes. She has no rales.  Abdominal: Soft. There is no tenderness.  Lymphadenopathy:    She has no cervical adenopathy.  Neurological: She is alert.  Skin: Skin is warm and dry.  Psychiatric: She has a normal mood and affect.  Nursing note and vitals reviewed.    ED Treatments / Results  Labs (all labs ordered are listed, but only abnormal results are displayed) Labs Reviewed - No data to display  EKG  EKG Interpretation None       Radiology No results found.  Procedures Procedures (including critical care time)  Medications Ordered in ED Medications  benzonatate (TESSALON) capsule 100 mg (not administered)     Initial Impression / Assessment and Plan / ED Course  I have reviewed the triage vital signs and the nursing notes.  Pertinent labs & imaging results that were available during my care of the patient were reviewed by me and considered in my medical decision making (see chart for details).     Patient seen and examined.  Medications ordered.   Vital signs reviewed and are as  follows: BP (!) 147/96 (BP Location: Left Arm)   Pulse 83   Temp 98.3 F (36.8 C) (Oral)   Resp 18   Ht  (1.676 m)   Wt 87.5 kg (193 lb)   LMP 10/19/2016   SpO2 100%   BMI 31.15 kg/m   Patient counseled on supportive care for viral URI and s/s to return including worsening symptoms, persistent fever, persistent vomiting, or if they have any other concerns. Urged to see PCP if symptoms persist for more than 3 days. Patient verbalizes understanding and agrees with plan.    Final Clinical Impressions(s) / ED Diagnoses   Final diagnoses:  Viral URI with cough   Patient with symptoms consistent with a viral syndrome. Vitals are stable, no fever. No signs of dehydration. Lung exam normal, no signs of pneumonia. Supportive therapy indicated with return if symptoms worsen.     New Prescriptions New Prescriptions   BENZONATATE (TESSALON) 100 MG CAPSULE    Take 1 capsule (  100 mg total) by mouth every 8 (eight) hours.   PSEUDOEPHEDRINE (SUDAFED 12 HOUR) 120 MG 12 HR TABLET    Take 1 tablet (120 mg total) by mouth 2 (two) times daily.     Renne Crigler, PA-C 11/18/16 2336    Paula Libra, MD 11/19/16 [phone]

## 2016-11-18 NOTE — ED Triage Notes (Signed)
Patient states that [facility] and was treated for a URI - she reports that the cough is getting worse and the promethazine that she was given and rx for is not working for her nausea with the cough.  Patient has a noted course cough.

## 2017-01-08 DIAGNOSIS — Z114 Encounter for screening for human immunodeficiency virus [HIV]: Secondary | ICD-10-CM | POA: Diagnosis not present

## 2017-01-08 DIAGNOSIS — R87610 Atypical squamous cells of undetermined significance on cytologic smear of cervix (ASC-US): Secondary | ICD-10-CM | POA: Diagnosis not present

## 2017-01-08 DIAGNOSIS — Z113 Encounter for screening for infections with a predominantly sexual mode of transmission: Secondary | ICD-10-CM | POA: Diagnosis not present

## 2017-01-08 DIAGNOSIS — Z01419 Encounter for gynecological examination (general) (routine) without abnormal findings: Secondary | ICD-10-CM | POA: Diagnosis not present

## 2017-01-08 DIAGNOSIS — Z1159 Encounter for screening for other viral diseases: Secondary | ICD-10-CM | POA: Diagnosis not present

## 2017-01-08 DIAGNOSIS — Z118 Encounter for screening for other infectious and parasitic diseases: Secondary | ICD-10-CM | POA: Diagnosis not present

## 2017-01-08 DIAGNOSIS — Z6831 Body mass index (BMI) 31.0-31.9, adult: Secondary | ICD-10-CM | POA: Diagnosis not present

## 2017-02-06 DIAGNOSIS — Z3491 Encounter for supervision of normal pregnancy, unspecified, first trimester: Secondary | ICD-10-CM | POA: Diagnosis not present

## 2017-02-06 DIAGNOSIS — Z3201 Encounter for pregnancy test, result positive: Secondary | ICD-10-CM | POA: Diagnosis not present

## 2017-02-09 ENCOUNTER — Emergency Department (HOSPITAL_BASED_OUTPATIENT_CLINIC_OR_DEPARTMENT_OTHER)
Admission: EM | Admit: 2017-02-09 | Discharge: 2017-02-09 | Disposition: A | Discharge status: Home / Self Care | Payer: BLUE CROSS/BLUE SHIELD | Attending: Emergency Medicine | Admitting: Emergency Medicine

## 2017-02-09 ENCOUNTER — Other Ambulatory Visit: Payer: Self-pay

## 2017-02-09 ENCOUNTER — Encounter (HOSPITAL_BASED_OUTPATIENT_CLINIC_OR_DEPARTMENT_OTHER): Payer: Self-pay | Admitting: Emergency Medicine

## 2017-02-09 DIAGNOSIS — M7918 Myalgia, other site: Secondary | ICD-10-CM | POA: Insufficient documentation

## 2017-02-09 DIAGNOSIS — J069 Acute upper respiratory infection, unspecified: Secondary | ICD-10-CM

## 2017-02-09 DIAGNOSIS — O9989 Other specified diseases and conditions complicating pregnancy, childbirth and the puerperium: Secondary | ICD-10-CM | POA: Insufficient documentation

## 2017-02-09 DIAGNOSIS — O2341 Unspecified infection of urinary tract in pregnancy, first trimester: Secondary | ICD-10-CM | POA: Diagnosis not present

## 2017-02-09 DIAGNOSIS — Z3A01 Less than 8 weeks gestation of pregnancy: Secondary | ICD-10-CM | POA: Diagnosis not present

## 2017-02-09 DIAGNOSIS — R05 Cough: Secondary | ICD-10-CM | POA: Diagnosis not present

## 2017-02-09 DIAGNOSIS — N390 Urinary tract infection, site not specified: Secondary | ICD-10-CM | POA: Diagnosis not present

## 2017-02-09 LAB — URINALYSIS, ROUTINE W REFLEX MICROSCOPIC
BILIRUBIN URINE: NEGATIVE
GLUCOSE, UA: NEGATIVE mg/dL
HGB URINE DIPSTICK: NEGATIVE
KETONES UR: NEGATIVE mg/dL
Nitrite: NEGATIVE
PROTEIN: NEGATIVE mg/dL
Specific Gravity, Urine: 1.025 (ref 1.005–1.030)
pH: 6 (ref 5.0–8.0)

## 2017-02-09 LAB — URINALYSIS, MICROSCOPIC (REFLEX): RBC / HPF: NONE SEEN RBC/hpf (ref 0–5)

## 2017-02-09 LAB — PREGNANCY, URINE: PREG TEST UR: POSITIVE — AB

## 2017-02-09 MED ORDER — SALINE SPRAY 0.65 % NA SOLN
1.0000 | Freq: Once | NASAL | Status: AC
  Administered 2017-02-09: 1 via NASAL
  Filled 2017-02-09: qty 44

## 2017-02-09 MED ORDER — AMOXICILLIN 500 MG PO CAPS
500.0000 mg | ORAL_CAPSULE | Freq: Once | ORAL | Status: AC
  Administered 2017-02-09: 500 mg via ORAL
  Filled 2017-02-09: qty 1

## 2017-02-09 MED ORDER — ACETAMINOPHEN 325 MG PO TABS
650.0000 mg | ORAL_TABLET | Freq: Once | ORAL | Status: AC
  Administered 2017-02-09: 650 mg via ORAL
  Filled 2017-02-09: qty 2

## 2017-02-09 MED ORDER — AMOXICILLIN 500 MG PO CAPS: 500.0000 mg | ORAL_CAPSULE | Freq: Three times a day (TID) | ORAL | 0 refills | Status: DC

## 2017-02-09 NOTE — ED Triage Notes (Signed)
Patient states that she recently found out that she is pregnant - she states that for the last week she has had generalize aches and pain throughout with a cough and sneeze.

## 2017-02-09 NOTE — ED Provider Notes (Signed)
[facility] EMERGENCY DEPARTMENT Provider Note   CSN: [id number] Arrival date & time: 02/09/17  1126     History   Chief Complaint Chief Complaint  Patient presents with  . Generalized Body Aches    HPI Kristina Ortiz is a 26 y.o. female.  Pt presents to the ED today with cough and generalized aches.  She said she has had fever as well.  Pt said sx have been going on for a week.  Pt has also recently found out that she is pregnant.  She went to [facility] last week and had it confirmed.  The pt said she was told she is about 5-[redacted] weeks pregnant.  When she went to get her blood drawn, she had a fever and has been taking tylenol.        History reviewed. No pertinent past medical history.  There are no active problems to display for this patient.   Past Surgical History:  Procedure Laterality Date  . TONSILLECTOMY      OB History    Gravida Para Term Preterm AB Living   1             SAB TAB Ectopic Multiple Live Births                   Home Medications    Prior to Admission medications   Medication Sig Start Date End Date Taking? Authorizing Provider  amoxicillin (AMOXIL) 500 MG capsule Take 1 capsule (500 mg total) by mouth 3 (three) times daily. 02/09/17   Jacalyn LefevreHaviland, Libbi Towner, MD  amoxicillin-clavulanate (AUGMENTIN) 875-125 MG tablet Take 1 tablet by mouth every 12 (twelve) hours. 02/28/16   Joy, Shawn C, PA-C  benzonatate (TESSALON) 100 MG capsule Take 1 capsule (100 mg total) by mouth every 8 (eight) hours. 11/18/16   Renne CriglerGeiple, Joshua, PA-C  cyclobenzaprine (FLEXERIL) 10 MG tablet Take 1 tablet (10 mg total) by mouth 2 (two) times daily as needed for muscle spasms. 12/20/15   Tilden Fossaees, Elizabeth, MD  doxycycline (VIBRAMYCIN) 100 MG capsule Take 1 capsule (100 mg total) by mouth 2 (two) times daily. One po bid x 14 days 09/02/15   Palumbo, April, MD  Mefenamic Acid 250 MG CAPS Take 1 capsule (250 mg total) by mouth every 6 (six) hours as needed. 05/06/14   Margarita Grizzleay,  Danielle, MD  metroNIDAZOLE (FLAGYL) 500 MG tablet Take 1 tablet (500 mg total) by mouth 2 (two) times daily. 08/28/15   Melton Krebsiley, Samantha Nicole, PA-C  metroNIDAZOLE (FLAGYL) 500 MG tablet Take 1 tablet (500 mg total) by mouth 2 (two) times daily. 09/04/15   Tilden Fossaees, Elizabeth, MD  naproxen (NAPROSYN) 375 MG tablet Take 1 tablet (375 mg total) by mouth 2 (two) times daily. 09/02/15   Palumbo, April, MD  ondansetron (ZOFRAN) 4 MG tablet Take 1 tablet (4 mg total) by mouth every 6 (six) hours. 08/28/15   Melton Krebsiley, Samantha Nicole, PA-C  promethazine-dextromethorphan (PROMETHAZINE-DM) 6.25-15 MG/5ML syrup Take 5 mLs by mouth 4 (four) times daily as needed. 02/25/16   Fayrene Helperran, Bowie, PA-C  pseudoephedrine (SUDAFED 12 HOUR) 120 MG 12 hr tablet Take 1 tablet (120 mg total) by mouth 2 (two) times daily. 11/18/16   Renne CriglerGeiple, Joshua, PA-C  UNKNOWN TO PATIENT     [provider]    Family History History reviewed. No pertinent family history.  Social History Social History   Tobacco Use  . Smoking status: Never Smoker  . Smokeless tobacco: Never Used  Substance Use Topics  .  Alcohol use: Yes    Comment: socially  . Drug use: No     Allergies   Cinnamon   Review of Systems Review of Systems  Constitutional: Positive for chills and fever.  HENT: Positive for congestion.   Respiratory: Positive for cough.   All other systems reviewed and are negative.    Physical Exam Updated Vital Signs BP 137/78 (BP Location: Right Arm)   Pulse 90   Temp 99.2 F (37.3 C) (Oral)   Resp 18   Ht 5\' 6"  (1.676 m)   Wt 90.7 kg (200 lb)   LMP 01/08/2017   SpO2 100%   BMI 32.28 kg/m   Physical Exam  Constitutional: She is oriented to person, place, and time. She appears well-developed and well-nourished.  HENT:  Head: Normocephalic and atraumatic.  Right Ear: External ear normal.  Left Ear: External ear normal.  Nose: Rhinorrhea present.  Mouth/Throat: Oropharynx is clear and moist.  Eyes:  Conjunctivae and EOM are normal. Pupils are equal, round, and reactive to light.  Neck: Normal range of motion. Neck supple.  Cardiovascular: Normal rate, regular rhythm, normal heart sounds and intact distal pulses.  Pulmonary/Chest: Effort normal and breath sounds normal.  Abdominal: Soft. Bowel sounds are normal.  Musculoskeletal: Normal range of motion.  Neurological: She is alert and oriented to person, place, and time.  Skin: Skin is warm and dry. Capillary refill takes less than 2 seconds.  Psychiatric: She has a normal mood and affect. Her behavior is normal. Judgment and thought content normal.  Nursing note and vitals reviewed.    ED Treatments / Results  Labs (all labs ordered are listed, but only abnormal results are displayed) Labs Reviewed  PREGNANCY, URINE - Abnormal; Notable for the following components:      Result Value   Preg Test, Ur POSITIVE (*)    All other components within normal limits  URINALYSIS, ROUTINE W REFLEX MICROSCOPIC - Abnormal; Notable for the following components:   APPearance CLOUDY (*)    Leukocytes, UA TRACE (*)    All other components within normal limits  URINALYSIS, MICROSCOPIC (REFLEX) - Abnormal; Notable for the following components:   Bacteria, UA MANY (*)    Squamous Epithelial / LPF 0-5 (*)    All other components within normal limits    EKG  EKG Interpretation None       Radiology No results found.  Procedures Procedures (including critical care time)  Medications Ordered in ED Medications  amoxicillin (AMOXIL) capsule 500 mg (not administered)  acetaminophen (TYLENOL) tablet 650 mg (650 mg Oral Given 02/09/17 1337)  sodium chloride (OCEAN) 0.65 % nasal spray 1 spray (1 spray Each Nare Given 02/09/17 1337)     Initial Impression / Assessment and Plan / ED Course  I have reviewed the triage vital signs and the nursing notes.  Pertinent labs & imaging results that were available during my care of the patient were  reviewed by me and considered in my medical decision making (see chart for details).     Sx are likely viral, but they have been going on for 1 week.  She is getting worse, so I will start her on amox.  She is given her first dose prior to d/c.  She knows to take tylenol for fever.  She is to f/u with pcp.  Return if worse.  Final Clinical Impressions(s) / ED Diagnoses   Final diagnoses:  Upper respiratory tract infection, unspecified type  Less than [redacted] weeks gestation  of pregnancy    ED Discharge Orders        Ordered    amoxicillin (AMOXIL) 500 MG capsule  3 times daily     02/09/17 1422       Jacalyn Lefevre, MD 02/09/17 1423

## 2017-02-18 NOTE — L&D Delivery Note (Signed)
Delivery Note At 11:02 AM a viable female was delivered via Vaginal, Spontaneous (Presentation: direct OA). Not a true compound hand but baby had right arm flexed with hand at the level of the neck. APGAR: 9, 9; weight pending.   Placenta status: delivered spontaneously and completely.  Cord: three vessel with the following complications: none.   Anesthesia:  Epidural Episiotomy: None Lacerations: None Suture Repair: n/a Est. Blood Loss (mL): 250  Mom to postpartum.  Baby to Couplet care / Skin to Skin.  After delivery, uterus was firm to massage but became slightly boggy when massage was stopped and bleeding restarted. Uterus was massaged again and bleeding stopped once more. 800mcg Misoprosotol administered PO to assist in firming uterine tone postpartum.   Janeece RiggersEllis K Khalif Stender 09/30/2017, 11:21 AM

## 2017-02-20 DIAGNOSIS — Z3201 Encounter for pregnancy test, result positive: Secondary | ICD-10-CM | POA: Diagnosis not present

## 2017-03-14 DIAGNOSIS — Z3481 Encounter for supervision of other normal pregnancy, first trimester: Secondary | ICD-10-CM | POA: Diagnosis not present

## 2017-03-20 ENCOUNTER — Other Ambulatory Visit: Payer: Self-pay

## 2017-03-20 ENCOUNTER — Emergency Department (HOSPITAL_BASED_OUTPATIENT_CLINIC_OR_DEPARTMENT_OTHER): Payer: BLUE CROSS/BLUE SHIELD

## 2017-03-20 ENCOUNTER — Encounter (HOSPITAL_BASED_OUTPATIENT_CLINIC_OR_DEPARTMENT_OTHER): Payer: Self-pay | Admitting: Emergency Medicine

## 2017-03-20 ENCOUNTER — Emergency Department (HOSPITAL_BASED_OUTPATIENT_CLINIC_OR_DEPARTMENT_OTHER)
Admission: EM | Admit: 2017-03-20 | Discharge: 2017-03-20 | Disposition: A | Discharge status: Home / Self Care | Payer: BLUE CROSS/BLUE SHIELD | Attending: Emergency Medicine | Admitting: Emergency Medicine

## 2017-03-20 DIAGNOSIS — R1013 Epigastric pain: Secondary | ICD-10-CM | POA: Diagnosis not present

## 2017-03-20 DIAGNOSIS — Z3A11 11 weeks gestation of pregnancy: Secondary | ICD-10-CM | POA: Insufficient documentation

## 2017-03-20 DIAGNOSIS — O26891 Other specified pregnancy related conditions, first trimester: Secondary | ICD-10-CM | POA: Diagnosis not present

## 2017-03-20 DIAGNOSIS — Z79899 Other long term (current) drug therapy: Secondary | ICD-10-CM | POA: Diagnosis not present

## 2017-03-20 DIAGNOSIS — R102 Pelvic and perineal pain: Secondary | ICD-10-CM | POA: Diagnosis not present

## 2017-03-20 LAB — URINALYSIS, ROUTINE W REFLEX MICROSCOPIC
Glucose, UA: NEGATIVE mg/dL
Hgb urine dipstick: NEGATIVE
Ketones, ur: 40 mg/dL — AB
Leukocytes, UA: NEGATIVE
NITRITE: NEGATIVE
PH: 6 (ref 5.0–8.0)
Protein, ur: NEGATIVE mg/dL

## 2017-03-20 LAB — COMPREHENSIVE METABOLIC PANEL
ALK PHOS: 42 U/L (ref 38–126)
ALT: 17 U/L (ref 14–54)
ANION GAP: 9 (ref 5–15)
AST: 21 U/L (ref 15–41)
Albumin: 3.5 g/dL (ref 3.5–5.0)
BILIRUBIN TOTAL: 0.5 mg/dL (ref 0.3–1.2)
BUN: 11 mg/dL (ref 6–20)
CALCIUM: 8.8 mg/dL — AB (ref 8.9–10.3)
CO2: 20 mmol/L — ABNORMAL LOW (ref 22–32)
CREATININE: 0.48 mg/dL (ref 0.44–1.00)
Chloride: 106 mmol/L (ref 101–111)
GFR calc Af Amer: >60 mL/min (ref >60)
GFR calc non Af Amer: >60 mL/min (ref >60)
Glucose, Bld: 95 mg/dL (ref 65–99)
Potassium: 3.7 mmol/L (ref 3.5–5.1)
Sodium: 135 mmol/L (ref 135–145)
TOTAL PROTEIN: 7.3 g/dL (ref 6.5–8.1)

## 2017-03-20 LAB — CBC WITH DIFFERENTIAL/PLATELET
Basophils Absolute: 0 10*3/uL (ref 0.0–0.1)
Basophils Relative: 0 %
Eosinophils Absolute: 0.1 10*3/uL (ref 0.0–0.7)
Eosinophils Relative: 1 %
HEMATOCRIT: 35.1 % — AB (ref 36.0–46.0)
HEMOGLOBIN: 12.2 g/dL (ref 12.0–15.0)
LYMPHS ABS: 2.5 10*3/uL (ref 0.7–4.0)
Lymphocytes Relative: 29 %
MCH: 28.6 pg (ref 26.0–34.0)
MCHC: 34.8 g/dL (ref 30.0–36.0)
MCV: 82.4 fL (ref 78.0–100.0)
MONO ABS: 0.8 10*3/uL (ref 0.1–1.0)
MONOS PCT: 9 %
NEUTROS ABS: 5.4 10*3/uL (ref 1.7–7.7)
NEUTROS PCT: 61 %
Platelets: 232 10*3/uL (ref 150–400)
RBC: 4.26 MIL/uL (ref 3.87–5.11)
RDW: 14.4 % (ref 11.5–15.5)
WBC: 8.8 10*3/uL (ref 4.0–10.5)

## 2017-03-20 LAB — HCG, QUANTITATIVE, PREGNANCY: hCG, Beta Chain, Quant, S: 128173 m[IU]/mL — ABNORMAL HIGH (ref <5)

## 2017-03-20 LAB — LIPASE, BLOOD: Lipase: 25 U/L (ref 11–51)

## 2017-03-20 MED ORDER — SODIUM CHLORIDE 0.9 % IV BOLUS (SEPSIS)
1000.0000 mL | Freq: Once | INTRAVENOUS | Status: AC
  Administered 2017-03-20: 1000 mL via INTRAVENOUS

## 2017-03-20 MED ORDER — ONDANSETRON HCL 4 MG/2ML IJ SOLN
4.0000 mg | Freq: Once | INTRAMUSCULAR | Status: AC
  Administered 2017-03-20: 4 mg via INTRAVENOUS
  Filled 2017-03-20: qty 2

## 2017-03-20 NOTE — ED Notes (Signed)
Pt verbalizes understanding of d/c instructions and denies any further needs at this time. 

## 2017-03-20 NOTE — ED Notes (Signed)
Pt states she already had an ABO/Rh at her OB, was not informed about needing a Rhogam shot.

## 2017-03-20 NOTE — ED Provider Notes (Signed)
[facility] EMERGENCY DEPARTMENT Provider Note   CSN: [id number] Arrival date & time: 03/20/17  0003     History   Chief Complaint Chief Complaint  Patient presents with  . Abdominal Pain    HPI Kristina Ortiz is a 27 y.o. female.  Patient is a 27 year old female G1 at approximately [redacted] weeks gestation.  She presents today for evaluation of epigastric pain.  This has been ongoing for the past 1-1/2 days.  Her pain is epigastric and associated with nausea and vomiting.  She denies any fevers or chills.  She denies any lower abdominal pain.  She denies any vaginal bleeding or spotting.      History reviewed. No pertinent past medical history.  There are no active problems to display for this patient.   Past Surgical History:  Procedure Laterality Date  . TONSILLECTOMY      OB History    Gravida Para Term Preterm AB Living   1             SAB TAB Ectopic Multiple Live Births                   Home Medications    Prior to Admission medications   Medication Sig Start Date End Date Taking? Authorizing Provider  amoxicillin (AMOXIL) 500 MG capsule Take 1 capsule (500 mg total) by mouth 3 (three) times daily. 02/09/17   Jacalyn Lefevre, MD  amoxicillin-clavulanate (AUGMENTIN) 875-125 MG tablet Take 1 tablet by mouth every 12 (twelve) hours. 02/28/16   Joy, Shawn C, PA-C  benzonatate (TESSALON) 100 MG capsule Take 1 capsule (100 mg total) by mouth every 8 (eight) hours. 11/18/16   Renne Crigler, PA-C  cyclobenzaprine (FLEXERIL) 10 MG tablet Take 1 tablet (10 mg total) by mouth 2 (two) times daily as needed for muscle spasms. 12/20/15   Tilden Fossa, MD  doxycycline (VIBRAMYCIN) 100 MG capsule Take 1 capsule (100 mg total) by mouth 2 (two) times daily. One po bid x 14 days 09/02/15   Palumbo, April, MD  Mefenamic Acid 250 MG CAPS Take 1 capsule (250 mg total) by mouth every 6 (six) hours as needed. 05/06/14   Margarita Grizzle, MD  metroNIDAZOLE (FLAGYL) 500 MG tablet  Take 1 tablet (500 mg total) by mouth 2 (two) times daily. 08/28/15   Melton Krebs, PA-C  metroNIDAZOLE (FLAGYL) 500 MG tablet Take 1 tablet (500 mg total) by mouth 2 (two) times daily. 09/04/15   Tilden Fossa, MD  naproxen (NAPROSYN) 375 MG tablet Take 1 tablet (375 mg total) by mouth 2 (two) times daily. 09/02/15   Palumbo, April, MD  ondansetron (ZOFRAN) 4 MG tablet Take 1 tablet (4 mg total) by mouth every 6 (six) hours. 08/28/15   Melton Krebs, PA-C  promethazine-dextromethorphan (PROMETHAZINE-DM) 6.25-15 MG/5ML syrup Take 5 mLs by mouth 4 (four) times daily as needed. 02/25/16   Fayrene Helper, PA-C  pseudoephedrine (SUDAFED 12 HOUR) 120 MG 12 hr tablet Take 1 tablet (120 mg total) by mouth 2 (two) times daily. 11/18/16   Renne Crigler, PA-C  UNKNOWN TO PATIENT     [provider]    Family History No family history on file.  Social History Social History   Tobacco Use  . Smoking status: Never Smoker  . Smokeless tobacco: Never Used  Substance Use Topics  . Alcohol use: Yes    Comment: socially  . Drug use: No     Allergies   Cinnamon   Review of  Systems Review of Systems  All other systems reviewed and are negative.    Physical Exam Updated Vital Signs BP (!) 146/86 (BP Location: Left Arm)   Pulse 92   Temp 98.3 F (36.8 C) (Oral)   Resp 18   Ht 5\' 7"  (1.702 m)   Wt 88.5 kg (195 lb)   LMP 01/08/2017   SpO2 100%   BMI 30.54 kg/m   Physical Exam  Constitutional: She is oriented to person, place, and time. She appears well-developed and well-nourished. No distress.  HENT:  Head: Normocephalic and atraumatic.  Neck: Normal range of motion. Neck supple.  Cardiovascular: Normal rate and regular rhythm. Exam reveals no gallop and no friction rub.  No murmur heard. Pulmonary/Chest: Effort normal and breath sounds normal. No respiratory distress. She has no wheezes.  Abdominal: Soft. Bowel sounds are normal. She exhibits no distension.  There is tenderness in the suprapubic area. There is no rigidity, no rebound and no guarding.  There is tenderness to palpation in the suprapubic region.  Musculoskeletal: Normal range of motion.  Neurological: She is alert and oriented to person, place, and time.  Skin: Skin is warm and dry. She is not diaphoretic.  Nursing note and vitals reviewed.    ED Treatments / Results  Labs (all labs ordered are listed, but only abnormal results are displayed) Labs Reviewed  URINALYSIS, ROUTINE W REFLEX MICROSCOPIC  COMPREHENSIVE METABOLIC PANEL  LIPASE, BLOOD  CBC WITH DIFFERENTIAL/PLATELET    EKG  EKG Interpretation None       Radiology US Ob Comp Less 14 Wks  Result Date: 03/20/2017 CLINICAL DATA:  Pelvic pain for 24 hours. Estimated gestational age by LMP is 10 weeks 1 day. Quantitative beta HCG was not collected. EXAM: OBSTETRIC <14 WK ULTRASOUND TECHNIQUE: Transabdominal ultrasound was performed for evaluation of the gestation as well as the maternal uterus and adnexal regions. COMPARISON:  None. FINDINGS: Intrauterine gestational sac: A single intrauterine gestational sac is identified. Yolk sac:  Yolk sac is present. Embryo:  Fetal pole is present. Cardiac Activity: Fetal cardiac activity is observed. Heart Rate: 145 bpm CRL: 47.7 mm   11 w 4 d                  [place] EDC: 10/05/2017 Subchorionic hemorrhage:  None visualized. Maternal uterus/adnexae: No myometrial mass lesions demonstrated. The left ovary is visualized and appears normal. Right ovary is not identified but no abnormal adnexal masses are seen in the visualized field of view. No free fluid in the pelvis. IMPRESSION: Single living intrauterine pregnancy with estimated gestational age by crown-rump length of 11 weeks 4 days. No acute complication is demonstrated sonographically. Electronically Signed   By: Burman Nieves M.D.   On: 03/20/2017 01:03    Procedures Procedures (including critical care time)  Medications  Ordered in ED Medications  sodium chloride 0.9 % bolus 1,000 mL (not administered)  ondansetron (ZOFRAN) injection 4 mg (not administered)     Initial Impression / Assessment and Plan / ED Course  I have reviewed the triage vital signs and the nursing notes.  Pertinent labs & imaging results that were available during my care of the patient were reviewed by me and considered in my medical decision making (see chart for details).  Laboratory studies are reassuring and ultrasound reveals an intrauterine pregnancy.  She appears to be most tender in the epigastric region, however LFTs, lipase, and CBC are normal.  I am uncertain as to the etiology of her  pain, however nothing appears emergent.  She will be discharged with Tylenol and follow-up as needed.  Final Clinical Impressions(s) / ED Diagnoses   Final diagnoses:  None    ED Discharge Orders    None       Geoffery Lyonselo, Richards Pherigo, MD 03/20/17 [phone]

## 2017-03-20 NOTE — ED Notes (Addendum)
Pt is pregnant and c/o cramping, no discharge, no bleeding.  Also c/o constant nausea, has not vomited since arriving to ED.  Pt given some crackers and ginger ale.

## 2017-03-20 NOTE — Discharge Instructions (Signed)
Tylenol 1000 mg every 6 hours as needed for pain.  Follow-up with your primary doctor if not improving in the next 3-4 days, and return to the ER if symptoms significantly worsen or change.

## 2017-03-20 NOTE — ED Triage Notes (Signed)
PT presents with c/o abdominal pain and nausea and is [redacted] weeks pregnant. First pregnancy

## 2017-04-11 DIAGNOSIS — Z3481 Encounter for supervision of other normal pregnancy, first trimester: Secondary | ICD-10-CM | POA: Diagnosis not present

## 2017-04-25 ENCOUNTER — Encounter: Payer: BLUE CROSS/BLUE SHIELD | Admitting: Certified Nurse Midwife

## 2017-04-25 DIAGNOSIS — Z3400 Encounter for supervision of normal first pregnancy, unspecified trimester: Secondary | ICD-10-CM | POA: Insufficient documentation

## 2017-04-29 ENCOUNTER — Encounter (HOSPITAL_BASED_OUTPATIENT_CLINIC_OR_DEPARTMENT_OTHER): Payer: Self-pay | Admitting: Emergency Medicine

## 2017-04-29 ENCOUNTER — Other Ambulatory Visit: Payer: Self-pay

## 2017-04-29 ENCOUNTER — Emergency Department (HOSPITAL_BASED_OUTPATIENT_CLINIC_OR_DEPARTMENT_OTHER)
Admission: EM | Admit: 2017-04-29 | Discharge: 2017-04-30 | Disposition: A | Discharge status: Home / Self Care | Payer: BLUE CROSS/BLUE SHIELD | Attending: Emergency Medicine | Admitting: Emergency Medicine

## 2017-04-29 DIAGNOSIS — O26892 Other specified pregnancy related conditions, second trimester: Secondary | ICD-10-CM | POA: Diagnosis not present

## 2017-04-29 DIAGNOSIS — R519 Headache, unspecified: Secondary | ICD-10-CM

## 2017-04-29 DIAGNOSIS — R51 Headache: Secondary | ICD-10-CM | POA: Diagnosis not present

## 2017-04-29 DIAGNOSIS — O9989 Other specified diseases and conditions complicating pregnancy, childbirth and the puerperium: Secondary | ICD-10-CM | POA: Diagnosis not present

## 2017-04-29 DIAGNOSIS — Z79899 Other long term (current) drug therapy: Secondary | ICD-10-CM | POA: Diagnosis not present

## 2017-04-29 DIAGNOSIS — Z3A17 17 weeks gestation of pregnancy: Secondary | ICD-10-CM | POA: Diagnosis not present

## 2017-04-29 MED ORDER — ACETAMINOPHEN 325 MG PO TABS
650.0000 mg | ORAL_TABLET | Freq: Once | ORAL | Status: AC
Start: 2017-04-29 — End: 2017-04-29
  Administered 2017-04-29: 650 mg via ORAL
  Filled 2017-04-29: qty 2

## 2017-04-29 MED ORDER — METOCLOPRAMIDE HCL 5 MG/ML IJ SOLN
10.0000 mg | Freq: Once | INTRAMUSCULAR | Status: AC
  Administered 2017-04-29: 10 mg via INTRAVENOUS
  Filled 2017-04-29: qty 2

## 2017-04-29 MED ORDER — DIPHENHYDRAMINE HCL 50 MG/ML IJ SOLN
25.0000 mg | Freq: Once | INTRAMUSCULAR | Status: AC
  Administered 2017-04-29: 25 mg via INTRAVENOUS
  Filled 2017-04-29: qty 1

## 2017-04-29 NOTE — ED Notes (Signed)
Patient's mother in law is outside eating her food per patient.  She stated that she will not drive when she gets this medicine.

## 2017-04-29 NOTE — ED Notes (Signed)
Unable to give Benadryl at this time until her mother in law is here.  Patient drove herself here.

## 2017-04-29 NOTE — ED Triage Notes (Signed)
Patient states that she is pregnant, she is unsure if she her headache has anything to do with the pregnancy but she has had a Headache for 3 days

## 2017-04-29 NOTE — ED Provider Notes (Signed)
[facility] EMERGENCY DEPARTMENT Provider Note   CSN: [id number] Arrival date & time: 04/29/17  2013     History   Chief Complaint Chief Complaint  Patient presents with  . Headache    [redacted] weeks pregnant    HPI Kristina Ortiz is a 27 y.o. female.  HPI  This is a 27 year old G1 P0 female who presents with headache.  Patient reports 3-day history of frontal headache.  She rates her pain 8 out of 10.  She has taken Tylenol with minimal relief.  She denies any history of significant headaches or migraines.  She denies any recent fevers or neck pain.  She does report generalized myalgias.  She reports photophobia.  No weakness, numbness, tingling, strokelike symptoms.  Denies any vision deficits..  Denies worst headache of her life.  She denies any abdominal pain or vaginal bleeding.  History reviewed. No pertinent past medical history.  Patient Active Problem List   Diagnosis Date Noted  . Supervision of normal first pregnancy, antepartum 04/25/2017    Past Surgical History:  Procedure Laterality Date  . TONSILLECTOMY      OB History    Gravida Para Term Preterm AB Living   1             SAB TAB Ectopic Multiple Live Births                   Home Medications    Prior to Admission medications   Medication Sig Start Date End Date Taking? Authorizing Provider  Prenatal Vit-Fe Fumarate-FA (PRENATAL MULTIVITAMIN) TABS tablet Take 1 tablet by mouth daily at 12 noon.   Yes [provider]  acetaminophen (TYLENOL) 500 MG tablet Take 1 tablet (500 mg total) by mouth every 6 (six) hours as needed. 04/30/17   Mistie Adney, Mayer Masker, MD  amoxicillin (AMOXIL) 500 MG capsule Take 1 capsule (500 mg total) by mouth 3 (three) times daily. 02/09/17   Jacalyn Lefevre, MD  amoxicillin-clavulanate (AUGMENTIN) 875-125 MG tablet Take 1 tablet by mouth every 12 (twelve) hours. 02/28/16   Joy, Shawn C, PA-C  benzonatate (TESSALON) 100 MG capsule Take 1 capsule (100 mg total) by mouth  every 8 (eight) hours. 11/18/16   Renne Crigler, PA-C  cyclobenzaprine (FLEXERIL) 10 MG tablet Take 1 tablet (10 mg total) by mouth 2 (two) times daily as needed for muscle spasms. 12/20/15   Tilden Fossa, MD  doxycycline (VIBRAMYCIN) 100 MG capsule Take 1 capsule (100 mg total) by mouth 2 (two) times daily. One po bid x 14 days 09/02/15   Palumbo, April, MD  Mefenamic Acid 250 MG CAPS Take 1 capsule (250 mg total) by mouth every 6 (six) hours as needed. 05/06/14   Margarita Grizzle, MD  metroNIDAZOLE (FLAGYL) 500 MG tablet Take 1 tablet (500 mg total) by mouth 2 (two) times daily. 08/28/15   Melton Krebs, PA-C  metroNIDAZOLE (FLAGYL) 500 MG tablet Take 1 tablet (500 mg total) by mouth 2 (two) times daily. 09/04/15   Tilden Fossa, MD  naproxen (NAPROSYN) 375 MG tablet Take 1 tablet (375 mg total) by mouth 2 (two) times daily. 09/02/15   Palumbo, April, MD  ondansetron (ZOFRAN) 4 MG tablet Take 1 tablet (4 mg total) by mouth every 6 (six) hours. 08/28/15   Melton Krebs, PA-C  promethazine-dextromethorphan (PROMETHAZINE-DM) 6.25-15 MG/5ML syrup Take 5 mLs by mouth 4 (four) times daily as needed. 02/25/16   Fayrene Helper, PA-C  pseudoephedrine (SUDAFED 12 HOUR) 120 MG 12 hr  tablet Take 1 tablet (120 mg total) by mouth 2 (two) times daily. 11/18/16   Renne Crigler, PA-C  UNKNOWN TO PATIENT     [provider]    Family History History reviewed. No pertinent family history.  Social History Social History   Tobacco Use  . Smoking status: Never Smoker  . Smokeless tobacco: Never Used  Substance Use Topics  . Alcohol use: Yes    Comment: socially  . Drug use: No     Allergies   Cinnamon   Review of Systems Review of Systems  Constitutional: Negative for fever.  Respiratory: Negative for shortness of breath.   Cardiovascular: Negative for chest pain.  Gastrointestinal: Negative for abdominal pain, nausea and vomiting.  Genitourinary: Negative for vaginal bleeding.    Musculoskeletal: Positive for myalgias. Negative for neck pain and neck stiffness.  Neurological: Positive for headaches. Negative for weakness and numbness.  All other systems reviewed and are negative.    Physical Exam Updated Vital Signs BP (!) 141/86   Pulse (!) 105   Temp 99.2 F (37.3 C) (Oral)   Resp 20   Ht 5\' 6"  (1.676 m)   Wt 88.5 kg (195 lb)   LMP 01/08/2017   SpO2 100%   BMI 31.47 kg/m   Physical Exam  Constitutional: She is oriented to person, place, and time. She appears well-developed and well-nourished. No distress.  HENT:  Head: Normocephalic and atraumatic.  Eyes: EOM are normal. Pupils are equal, round, and reactive to light.  Neck: Normal range of motion. Neck supple.  No evidence of meningismus  Cardiovascular: Normal rate, regular rhythm and normal heart sounds.  Pulmonary/Chest: Effort normal. No respiratory distress. She has no wheezes.  Abdominal: Soft. Bowel sounds are normal.  Gravid above the pubic symphysis, no tenderness  Neurological: She is alert and oriented to person, place, and time.  Cranial nerves II through XII intact, 5 out of 5 strength in all 4 extremities, no dysmetria to finger-nose-finger  Skin: Skin is warm and dry.  Psychiatric: She has a normal mood and affect.  Nursing note and vitals reviewed.    ED Treatments / Results  Labs (all labs ordered are listed, but only abnormal results are displayed) Labs Reviewed - No data to display  EKG  EKG Interpretation None       Radiology No results found.  Procedures Procedures (including critical care time)  Medications Ordered in ED Medications  acetaminophen (TYLENOL) tablet 650 mg (650 mg Oral Given 04/29/17 2321)  diphenhydrAMINE (BENADRYL) injection 25 mg (25 mg Intravenous Given 04/29/17 2344)  metoCLOPramide (REGLAN) injection 10 mg (10 mg Intravenous Given 04/29/17 2321)     Initial Impression / Assessment and Plan / ED Course  I have reviewed the triage  vital signs and the nursing notes.  Pertinent labs & imaging results that were available during my care of the patient were reviewed by me and considered in my medical decision making (see chart for details).     Patient presents with headache.  Ongoing for 3 days.  She is nontoxic appearing vital signs are reassuring.  She is currently pregnant.  Neurologic exam is normal.  Denies worst headache of her life.  Doubt subarachnoid hemorrhage.  Denies infectious symptoms.  No meningismus to suggest meningitis.  Patient was given Reglan and Benadryl.  12:40 AM On recheck, she states she feels much better.  She reports she is ready to go home.  Follow-up with primary physician or OB/GYN recommended.  After  history, exam, and medical workup I feel the patient has been appropriately medically screened and is safe for discharge home. Pertinent diagnoses were discussed with the patient. Patient was given return precautions.   Final Clinical Impressions(s) / ED Diagnoses   Final diagnoses:  Acute nonintractable headache, unspecified headache type    ED Discharge Orders        Ordered    acetaminophen (TYLENOL) 500 MG tablet  Every 6 hours PRN     04/30/17 0039       Shon BatonHorton, Delonta Yohannes F, MD 04/30/17 0041

## 2017-04-29 NOTE — ED Notes (Signed)
ED Provider at bedside. 

## 2017-04-30 LAB — OB RESULTS CONSOLE RUBELLA ANTIBODY, IGM: RUBELLA: IMMUNE

## 2017-04-30 LAB — OB RESULTS CONSOLE ANTIBODY SCREEN: ANTIBODY SCREEN: NEGATIVE

## 2017-04-30 LAB — OB RESULTS CONSOLE HIV ANTIBODY (ROUTINE TESTING): HIV: NONREACTIVE

## 2017-04-30 LAB — OB RESULTS CONSOLE ABO/RH: RH TYPE: POSITIVE

## 2017-04-30 LAB — OB RESULTS CONSOLE HEPATITIS B SURFACE ANTIGEN: HEP B S AG: NEGATIVE

## 2017-04-30 LAB — OB RESULTS CONSOLE RPR: RPR: NONREACTIVE

## 2017-04-30 MED ORDER — ACETAMINOPHEN 500 MG PO TABS: 500.0000 mg | ORAL_TABLET | Freq: Four times a day (QID) | ORAL | 0 refills | Status: DC | PRN

## 2017-04-30 NOTE — Discharge Instructions (Signed)
Were seen today for headache.  Your symptoms resolved with medications.  Take Tylenol as needed.  Given your pregnancy, your limited with other medications that you can take.  Follow-up with your primary physician.

## 2017-05-23 ENCOUNTER — Other Ambulatory Visit: Payer: Self-pay

## 2017-05-23 ENCOUNTER — Encounter (HOSPITAL_COMMUNITY): Payer: Self-pay | Admitting: *Deleted

## 2017-05-23 ENCOUNTER — Inpatient Hospital Stay (HOSPITAL_COMMUNITY)
Admission: AD | Admit: 2017-05-23 | Discharge: 2017-05-23 | Disposition: A | Discharge status: Home / Self Care | Payer: BLUE CROSS/BLUE SHIELD | Source: Ambulatory Visit | Attending: Obstetrics and Gynecology | Admitting: Obstetrics and Gynecology

## 2017-05-23 DIAGNOSIS — Z3A21 21 weeks gestation of pregnancy: Secondary | ICD-10-CM | POA: Insufficient documentation

## 2017-05-23 DIAGNOSIS — R42 Dizziness and giddiness: Secondary | ICD-10-CM | POA: Insufficient documentation

## 2017-05-23 DIAGNOSIS — O26892 Other specified pregnancy related conditions, second trimester: Secondary | ICD-10-CM | POA: Diagnosis not present

## 2017-05-23 HISTORY — DX: Other specified health status: Z78.9

## 2017-05-23 LAB — URINALYSIS, ROUTINE W REFLEX MICROSCOPIC
Bilirubin Urine: NEGATIVE
GLUCOSE, UA: 50 mg/dL — AB
Hgb urine dipstick: NEGATIVE
Ketones, ur: NEGATIVE mg/dL
LEUKOCYTES UA: NEGATIVE
NITRITE: NEGATIVE
PH: 6 (ref 5.0–8.0)
Protein, ur: NEGATIVE mg/dL
SPECIFIC GRAVITY, URINE: 1.019 (ref 1.005–1.030)

## 2017-05-23 LAB — CBC
HEMATOCRIT: 30.7 % — AB (ref 36.0–46.0)
HEMOGLOBIN: 10.8 g/dL — AB (ref 12.0–15.0)
MCH: 29.4 pg (ref 26.0–34.0)
MCHC: 35.2 g/dL (ref 30.0–36.0)
MCV: 83.7 fL (ref 78.0–100.0)
Platelets: 230 10*3/uL (ref 150–400)
RBC: 3.67 MIL/uL — AB (ref 3.87–5.11)
RDW: 14.5 % (ref 11.5–15.5)
WBC: 9.6 10*3/uL (ref 4.0–10.5)

## 2017-05-23 NOTE — Discharge Instructions (Signed)
Dizziness °Dizziness is a common problem. It is a feeling of unsteadiness or light-headedness. You may feel like you are about to faint. Dizziness can lead to injury if you stumble or fall. Anyone can become dizzy, but dizziness is more common in older adults. This condition can be caused by a number of things, including medicines, dehydration, or illness. °Follow these instructions at home: °Eating and drinking °· Drink enough fluid to keep your urine clear or pale yellow. This helps to keep you from becoming dehydrated. Try to drink more clear fluids, such as water. °· Do not drink alcohol. °· Limit your caffeine intake if told to do so by your health care provider. Check ingredients and nutrition facts to see if a food or beverage contains caffeine. °· Limit your salt (sodium) intake if told to do so by your health care provider. Check ingredients and nutrition facts to see if a food or beverage contains sodium. °Activity °· Avoid making quick movements. °? Rise slowly from chairs and steady yourself until you feel okay. °? In the morning, first sit up on the side of the bed. When you feel okay, stand slowly while you hold onto something until you know that your balance is fine. °· If you need to stand in one place for a long time, move your legs often. Tighten and relax the muscles in your legs while you are standing. °· Do not drive or use heavy machinery if you feel dizzy. °· Avoid bending down if you feel dizzy. Place items in your home so that they are easy for you to reach without leaning over. °Lifestyle °· Do not use any products that contain nicotine or tobacco, such as cigarettes and e-cigarettes. If you need help quitting, ask your health care provider. °· Try to reduce your stress level by using methods such as yoga or meditation. Talk with your health care provider if you need help to manage your stress. °General instructions °· Watch your dizziness for any changes. °· Take over-the-counter and  prescription medicines only as told by your health care provider. Talk with your health care provider if you think that your dizziness is caused by a medicine that you are taking. °· Tell a friend or a family member that you are feeling dizzy. If he or she notices any changes in your behavior, have this person call your health care provider. °· Keep all follow-up visits as told by your health care provider. This is important. °Contact a health care provider if: °· Your dizziness does not go away. °· Your dizziness or light-headedness gets worse. °· You feel nauseous. °· You have reduced hearing. °· You have new symptoms. °· You are unsteady on your feet or you feel like the room is spinning. °Get help right away if: °· You vomit or have diarrhea and are unable to eat or drink anything. °· You have problems talking, walking, swallowing, or using your arms, hands, or legs. °· You feel generally weak. °· You are not thinking clearly or you have trouble forming sentences. It may take a friend or family member to notice this. °· You have chest pain, abdominal pain, shortness of breath, or sweating. °· Your vision changes. °· You have any bleeding. °· You have a severe headache. °· You have neck pain or a stiff neck. °· You have a fever. °These symptoms may represent a serious problem that is an emergency. Do not wait to see if the symptoms will go away. Get medical help   right away. Call your local emergency services (911 in the [place] Do not drive yourself to the hospital. °Summary °· Dizziness is a feeling of unsteadiness or light-headedness. This condition can be caused by a number of things, including medicines, dehydration, or illness. °· Anyone can become dizzy, but dizziness is more common in older adults. °· Drink enough fluid to keep your urine clear or pale yellow. Do not drink alcohol. °· Avoid making quick movements if you feel dizzy. Monitor your dizziness for any changes. °This information is not intended to  replace advice given to you by your health care provider. Make sure you discuss any questions you have with your health care provider. °Document Released: 07/31/2000 Document Revised: 03/09/2016 Document Reviewed: 03/09/2016 °Elsevier Interactive Patient Education © 2018 [employer] ° °

## 2017-05-23 NOTE — MAU Provider Note (Signed)
Chief Complaint:  Loss of Consciousness (yesterday 05/22/17)   None    HPI: Kristina Ortiz is a 27 y.o. G1P0 at 656w4d who presents to maternity admissions reporting dizziness yesterday and she blacked out while at work for unknown amount of time. Did not come yesterday for eval because she was too dizzy..  Associated signs and symptoms: nausea today, dizzy this AM - no sx now  Denies contractions, leakage of fluid or vaginal bleeding. Good fetal movement.   Pregnancy Course:   Past Medical History:  Diagnosis Date  . Medical history non-contributory    OB History  Gravida Para Term Preterm AB Living  1            SAB TAB Ectopic Multiple Live Births               # Outcome Date GA Lbr Len/2nd Weight Sex Delivery Anes PTL Lv  1 Current            Past Surgical History:  Procedure Laterality Date  . TONSILLECTOMY    . WISDOM TOOTH EXTRACTION     Family History  Problem Relation Age of Onset  . Hypertension Mother   . Hypertension Father   . COPD Paternal Aunt    Social History   Tobacco Use  . Smoking status: Never Smoker  . Smokeless tobacco: Never Used  Substance Use Topics  . Alcohol use: Not Currently    Comment: socially  . Drug use: No   Allergies  Allergen Reactions  . Cinnamon     Facial swelling   Medications Prior to Admission  Medication Sig Dispense Refill Last Dose  . Prenatal MV-Min-FA-Omega-3 (PRENATAL GUMMIES/DHA & FA PO) Take 2 capsules by mouth daily.   05/23/2017 at Unknown time  . acetaminophen (TYLENOL) 500 MG tablet Take 1 tablet (500 mg total) by mouth every 6 (six) hours as needed. (Patient not taking: Reported on 05/23/2017) 30 tablet 0 Not Taking at Unknown time  . amoxicillin (AMOXIL) 500 MG capsule Take 1 capsule (500 mg total) by mouth 3 (three) times daily. (Patient not taking: Reported on 05/23/2017) 21 capsule 0 Not Taking at Unknown time    I have reviewed patient's Past Medical Hx, Surgical Hx, Family Hx, Social Hx, medications and  allergies.   ROS:  Review of Systems  Constitutional: Negative.   Respiratory: Negative.   Neurological: Negative.   All other systems reviewed and are negative.   Physical Exam   Patient Vitals for the past 24 hrs:  BP Temp Temp src Pulse Resp Height Weight  05/23/17 2106 140/78 98.3 F (36.8 C) Oral 87 18 - -  05/23/17 1856 (!) 143/73 98.2 F (36.8 C) Oral 92 17 5\' 6"  (1.676 m) 91.2 kg (201 lb)   Constitutional: Well-developed, well-nourished female in no acute distress.  Cardiovascular: normal rate Respiratory: normal effort, CTAB GI: Abd soft, non-tender, gravid appropriate for gestational age.  MS: Extremities nontender, no edema, normal ROM Neurologic: Alert and oriented x 4.  GU: deferred     FHT:  Baseline 150   Labs: Results for orders placed or performed during the hospital encounter of 05/23/17 (from the past 24 hour(s))  Urinalysis, Routine w reflex microscopic     Status: Abnormal   Collection Time: 05/23/17  6:55 PM  Result Value Ref Range   Color, Urine YELLOW YELLOW   APPearance HAZY (A) CLEAR   Specific Gravity, Urine 1.019 1.005 - 1.030   pH 6.0 5.0 - 8.0  Glucose, UA 50 (A) NEGATIVE mg/dL   Hgb urine dipstick NEGATIVE NEGATIVE   Bilirubin Urine NEGATIVE NEGATIVE   Ketones, ur NEGATIVE NEGATIVE mg/dL   Protein, ur NEGATIVE NEGATIVE mg/dL   Nitrite NEGATIVE NEGATIVE   Leukocytes, UA NEGATIVE NEGATIVE  CBC     Status: Abnormal   Collection Time: 05/23/17  7:50 PM  Result Value Ref Range   WBC 9.6 4.0 - 10.5 K/uL   RBC 3.67 (L) 3.87 - 5.11 MIL/uL   Hemoglobin 10.8 (L) 12.0 - 15.0 g/dL   HCT 16.1 (L) 09.6 - 04.5 %   MCV 83.7 78.0 - 100.0 fL   MCH 29.4 26.0 - 34.0 pg   MCHC 35.2 30.0 - 36.0 g/dL   RDW 40.9 81.1 - 91.4 %   Platelets 230 150 - 400 K/uL    Imaging:  No results found.  MAU Course: Orders Placed This Encounter  Procedures  . Urinalysis, Routine w reflex microscopic  . CBC  . Orthostatic vital signs  . Discharge patient  Discharge disposition: 01-Home or Self Care; Discharge patient date: 05/23/2017   No orders of the defined types were placed in this encounter.   MDM: PMHx, prior ED notes, and labs reviewed. Pt stable,  Assessment: 1. Dizziness    Suspect chronic hypertension based on BP here and in ED in January 2019. Needs f/u in office.   Plan: Discharge home in stable condition.  Encouraged hydration and increased snacking.  Follow-up Information    [facility] Follow up.   Specialty:  Obstetrics and Gynecology Contact information: [address] [place] [address] [phone]          Allergies as of 05/23/2017      Reactions   Cinnamon    Facial swelling      Medication List    STOP taking these medications   amoxicillin 500 MG capsule Commonly known as:  AMOXIL     TAKE these medications   acetaminophen 500 MG tablet Commonly known as:  TYLENOL Take 1 tablet (500 mg total) by mouth every 6 (six) hours as needed.   PRENATAL GUMMIES/DHA & FA PO Take 2 capsules by mouth daily.       Romaine Maciolek, Faylene Million, CNM 05/23/2017 9:11 PM

## 2017-05-23 NOTE — MAU Note (Signed)
Pt presents to MAU stating she passed out at work yesterday morning at 0730. Pt states she completely lost conscience but is unsure of how long. Pt states her coworkers caught her before she fell and were able to lay her down. Pt denies vaginal bleeding or LOF. Pt reports lower left side abdominal that feels like pressure. Pt reports dizziness this morning as well as nausea. No loss of conscience today.

## 2017-05-25 ENCOUNTER — Inpatient Hospital Stay (HOSPITAL_COMMUNITY)
Admission: AD | Admit: 2017-05-25 | Discharge: 2017-05-26 | Disposition: A | Discharge status: Home / Self Care | Payer: Medicaid Other | Source: Ambulatory Visit | Attending: Obstetrics and Gynecology | Admitting: Obstetrics and Gynecology

## 2017-05-25 ENCOUNTER — Encounter (HOSPITAL_COMMUNITY): Payer: Self-pay | Admitting: *Deleted

## 2017-05-25 ENCOUNTER — Other Ambulatory Visit: Payer: Self-pay

## 2017-05-25 DIAGNOSIS — R030 Elevated blood-pressure reading, without diagnosis of hypertension: Secondary | ICD-10-CM | POA: Diagnosis not present

## 2017-05-25 DIAGNOSIS — Z3A20 20 weeks gestation of pregnancy: Secondary | ICD-10-CM | POA: Insufficient documentation

## 2017-05-25 DIAGNOSIS — R42 Dizziness and giddiness: Secondary | ICD-10-CM | POA: Insufficient documentation

## 2017-05-25 DIAGNOSIS — O26892 Other specified pregnancy related conditions, second trimester: Secondary | ICD-10-CM | POA: Insufficient documentation

## 2017-05-25 LAB — COMPREHENSIVE METABOLIC PANEL
ALBUMIN: 3 g/dL — AB (ref 3.5–5.0)
ALT: 25 U/L (ref 14–54)
ANION GAP: 9 (ref 5–15)
AST: 20 U/L (ref 15–41)
Alkaline Phosphatase: 53 U/L (ref 38–126)
BUN: 9 mg/dL (ref 6–20)
CHLORIDE: 104 mmol/L (ref 101–111)
CO2: 19 mmol/L — AB (ref 22–32)
Calcium: 8.6 mg/dL — ABNORMAL LOW (ref 8.9–10.3)
Creatinine, Ser: 0.53 mg/dL (ref 0.44–1.00)
GFR calc non Af Amer: >60 mL/min (ref >60)
GLUCOSE: 111 mg/dL — AB (ref 65–99)
POTASSIUM: 3.1 mmol/L — AB (ref 3.5–5.1)
SODIUM: 132 mmol/L — AB (ref 135–145)
Total Bilirubin: 0.7 mg/dL (ref 0.3–1.2)
Total Protein: 6.3 g/dL — ABNORMAL LOW (ref 6.5–8.1)

## 2017-05-25 LAB — URINALYSIS, ROUTINE W REFLEX MICROSCOPIC
BILIRUBIN URINE: NEGATIVE
Bacteria, UA: NONE SEEN
Glucose, UA: 50 mg/dL — AB
Hgb urine dipstick: NEGATIVE
Ketones, ur: 5 mg/dL — AB
Nitrite: NEGATIVE
PH: 5 (ref 5.0–8.0)
Protein, ur: NEGATIVE mg/dL
SPECIFIC GRAVITY, URINE: 1.018 (ref 1.005–1.030)

## 2017-05-25 LAB — CBC
HCT: 29.4 % — ABNORMAL LOW (ref 36.0–46.0)
Hemoglobin: 10.2 g/dL — ABNORMAL LOW (ref 12.0–15.0)
MCH: 29.1 pg (ref 26.0–34.0)
MCHC: 34.7 g/dL (ref 30.0–36.0)
MCV: 83.8 fL (ref 78.0–100.0)
PLATELETS: 214 10*3/uL (ref 150–400)
RBC: 3.51 MIL/uL — ABNORMAL LOW (ref 3.87–5.11)
RDW: 14.5 % (ref 11.5–15.5)
WBC: 11.1 10*3/uL — AB (ref 4.0–10.5)

## 2017-05-25 LAB — PROTEIN / CREATININE RATIO, URINE
CREATININE, URINE: 134 mg/dL
PROTEIN CREATININE RATIO: 0.07 mg/mg{creat} (ref 0.00–0.15)
TOTAL PROTEIN, URINE: 9 mg/dL

## 2017-05-25 LAB — URIC ACID: Uric Acid, Serum: 3.5 mg/dL (ref 2.3–6.6)

## 2017-05-25 LAB — GLUCOSE, CAPILLARY: Glucose-Capillary: 87 mg/dL (ref 65–99)

## 2017-05-25 NOTE — MAU Provider Note (Addendum)
History     CSN: [id number]  Arrival date and time: 05/25/17 2117   None     Chief Complaint  Patient presents with  . Dizziness   HPI The pt presents c/o feeling light headed and dizzy (room is not spinning).  She reports passing out the other day at work on [employer].  Her co-workers said she was out for about 20-6430min.  She could hear them talking though.  She had some nausea at the time and felt her heart racing as well and some blurry vision.  She reports occas HAs but she thought it might have been because of not eating appropriately.  Denies any SOB/HAs/light headedness or dizziness now.  She works from [employer] and then from 4pm-9pm on another job.  She is on her feet most of the day and she works M-F at her day job and about 4 days a week at her other job.  She came today because her co-workers said she did not look "right" and again felt light headed but did not pass out.  She felt like she could though, the same as the other day.    OB History    Gravida  1   Para      Term      Preterm      AB      Living        SAB      TAB      Ectopic      Multiple      Live Births              Past Medical History:  Diagnosis Date  . Medical history non-contributory     Past Surgical History:  Procedure Laterality Date  . TONSILLECTOMY    . WISDOM TOOTH EXTRACTION      Family History  Problem Relation Age of Onset  . Hypertension Mother   . Hypertension Father   . COPD Paternal Aunt     Social History   Tobacco Use  . Smoking status: Never Smoker  . Smokeless tobacco: Never Used  Substance Use Topics  . Alcohol use: Not Currently    Comment: socially  . Drug use: No    Allergies:  Allergies  Allergen Reactions  . Cinnamon     Facial swelling    Medications Prior to Admission  Medication Sig Dispense Refill Last Dose  . acetaminophen (TYLENOL) 500 MG tablet Take 1 tablet (500 mg total) by mouth every 6 (six) hours as needed. (Patient not  taking: Reported on 05/23/2017) 30 tablet 0 Not Taking at Unknown time  . Prenatal MV-Min-FA-Omega-3 (PRENATAL GUMMIES/DHA & FA PO) Take 2 capsules by mouth daily.   05/23/2017 at Unknown time    Review of Systems  Denies F/C/V/Abd pain/D/VB/cramping or ctxs Physical Exam   Blood pressure 130/84, pulse 95, temperature 98.9 F (37.2 C), resp. rate 18, last menstrual period 01/08/2017, SpO2 100 %.  Physical Exam Lungs CTA CV RRR ABD soft, NT Ext no calf tenderness FHTs 145 MAU Course  Procedures  UA EKG - nl sinus rhythm Labs (including TSH) CBG - 87  Assessment and Plan  P0 at 20 6/7wks with recent h/o passing out and the above complaints.  BP is mildly elevated but currently pt is asymptomatic but a little dehydrated.  She is aggressively hydrating with water.  As long as labs return nl, I recommend she increase her water intake and perhaps decrease the amount of working she  is doing and see if her sxs improve.  Note for work today.  Keep appt on Thurs in the office.  Purcell Nails 05/25/2017, 10:25 PM

## 2017-05-25 NOTE — MAU Note (Signed)
Pt reports that she was at work today at felt like she was going to pass out. She feels nauseate. Was seen in MAU on Friday and was told that she needed to drink more water. Pt reports L upper abdominal pain sicne last week. Pt also reports that her hands feel numb and tingling.

## 2017-05-25 NOTE — Discharge Instructions (Signed)

## 2017-05-26 LAB — TSH: TSH: 1.22 u[IU]/mL (ref 0.350–4.500)

## 2017-06-11 ENCOUNTER — Encounter (HOSPITAL_BASED_OUTPATIENT_CLINIC_OR_DEPARTMENT_OTHER): Payer: Self-pay

## 2017-06-11 ENCOUNTER — Other Ambulatory Visit: Payer: Self-pay

## 2017-06-11 ENCOUNTER — Emergency Department (HOSPITAL_BASED_OUTPATIENT_CLINIC_OR_DEPARTMENT_OTHER)
Admission: EM | Admit: 2017-06-11 | Discharge: 2017-06-11 | Disposition: A | Discharge status: Home / Self Care | Payer: BLUE CROSS/BLUE SHIELD | Attending: Physician Assistant | Admitting: Physician Assistant

## 2017-06-11 DIAGNOSIS — O9989 Other specified diseases and conditions complicating pregnancy, childbirth and the puerperium: Secondary | ICD-10-CM | POA: Diagnosis not present

## 2017-06-11 DIAGNOSIS — H9203 Otalgia, bilateral: Secondary | ICD-10-CM | POA: Diagnosis not present

## 2017-06-11 DIAGNOSIS — J0100 Acute maxillary sinusitis, unspecified: Secondary | ICD-10-CM | POA: Diagnosis not present

## 2017-06-11 DIAGNOSIS — Z79899 Other long term (current) drug therapy: Secondary | ICD-10-CM | POA: Insufficient documentation

## 2017-06-11 DIAGNOSIS — R05 Cough: Secondary | ICD-10-CM | POA: Insufficient documentation

## 2017-06-11 DIAGNOSIS — Z3A20 20 weeks gestation of pregnancy: Secondary | ICD-10-CM | POA: Diagnosis not present

## 2017-06-11 DIAGNOSIS — R0981 Nasal congestion: Secondary | ICD-10-CM | POA: Diagnosis not present

## 2017-06-11 MED ORDER — AMOXICILLIN 500 MG PO CAPS
500.0000 mg | ORAL_CAPSULE | Freq: Once | ORAL | Status: AC
  Administered 2017-06-11: 500 mg via ORAL
  Filled 2017-06-11: qty 1

## 2017-06-11 MED ORDER — AMOXICILLIN 500 MG PO CAPS: 500.0000 mg | ORAL_CAPSULE | Freq: Three times a day (TID) | ORAL | 0 refills | Status: DC

## 2017-06-11 MED FILL — AMOXICILLIN 500 MG CAPSULE: 500 | 7 days supply | Qty: 21 | Fill #0

## 2017-06-11 NOTE — ED Triage Notes (Signed)
C/o flu like sx x 2 weeks-pt is 5mos pregnant-NAD-steady gait

## 2017-06-11 NOTE — Discharge Instructions (Signed)
We think you likely have a sinus infection.  We will give you an trial of antibiotics.  It is possible this is just viral.  Recommend that yiou continue to use symptomatic care at home, including trying a Nettie pot.

## 2017-06-11 NOTE — ED Provider Notes (Signed)
[facility] EMERGENCY DEPARTMENT Provider Note   CSN: [id number] Arrival date & time: 06/11/17  1200     History   Chief Complaint Chief Complaint  Patient presents with  . Cough    HPI Kristina Ortiz is a 27 y.o. female.  HPI   Patient is 27 year old female presenting with 10 days of sinus congestion, bilateral earache and stuffiness.  Patient reports she is tried everything at home.  She tried multiple types of over-the-counter medications including nasal sprays, decongestants.  Patient is 5 months pregnant.  No fevers no nausea no vomiting no cough.  Past Medical History:  Diagnosis Date  . Medical history non-contributory     Patient Active Problem List   Diagnosis Date Noted  . Supervision of normal first pregnancy, antepartum 04/25/2017    Past Surgical History:  Procedure Laterality Date  . TONSILLECTOMY    . WISDOM TOOTH EXTRACTION       OB History    Gravida  1   Para      Term      Preterm      AB      Living        SAB      TAB      Ectopic      Multiple      Live Births               Home Medications    Prior to Admission medications   Medication Sig Start Date End Date Taking? Authorizing Provider  Prenatal MV-Min-FA-Omega-3 (PRENATAL GUMMIES/DHA & FA PO) Take 2 capsules by mouth daily.    [provider]    Family History Family History  Problem Relation Age of Onset  . Hypertension Mother   . Hypertension Father   . COPD Paternal Aunt     Social History Social History   Tobacco Use  . Smoking status: Never Smoker  . Smokeless tobacco: Never Used  Substance Use Topics  . Alcohol use: Not Currently    Comment: socially  . Drug use: No     Allergies   Cinnamon   Review of Systems Review of Systems  Constitutional: Negative for activity change.  HENT: Positive for congestion, sinus pressure and sinus pain.   Respiratory: Negative for shortness of breath.   Cardiovascular: Negative  for chest pain.  Gastrointestinal: Negative for abdominal pain.  All other systems reviewed and are negative.    Physical Exam Updated Vital Signs BP 134/78 (BP Location: Left Arm)   Pulse 99   Temp 98.5 F (36.9 C) (Oral)   Resp 20   Ht 5\' 6"  (1.676 m)   Wt 90.5 kg (199 lb 8.3 oz)   LMP 01/08/2017   SpO2 100%   BMI 32.20 kg/m   Physical Exam  Constitutional: She is oriented to person, place, and time. She appears well-developed and well-nourished.  HENT:  Head: Normocephalic and atraumatic.  Mouth/Throat: Oropharynx is clear and moist. No oropharyngeal exudate.  TM bulging, opaque   Eyes: Right eye exhibits no discharge. Left eye exhibits no discharge.  Cardiovascular: Normal rate and regular rhythm.  Pulmonary/Chest: Effort normal and breath sounds normal. No respiratory distress.  Neurological: She is oriented to person, place, and time.  Skin: Skin is warm and dry. She is not diaphoretic.  Psychiatric: She has a normal mood and affect.  Nursing note and vitals reviewed.    ED Treatments / Results  Labs (all labs ordered are listed, but only  abnormal results are displayed) Labs Reviewed - No data to display  EKG None  Radiology No results found.  Procedures Procedures (including critical care time)  Medications Ordered in ED Medications  amoxicillin (AMOXIL) capsule 500 mg (has no administration in time range)     Initial Impression / Assessment and Plan / ED Course  I have reviewed the triage vital signs and the nursing notes.  Pertinent labs & imaging results that were available during my care of the patient were reviewed by me and considered in my medical decision making (see chart for details).    Patient is 27 year old female presenting with 10 days of sinus congestion, bilateral earache and stuffiness.  Patient reports she is tried everything at home.  She tried multiple types of over-the-counter medications including nasal sprays,  decongestants.  Patient is 5 months pregnant.  No fevers no nausea no vomiting no cough.  12:44 PM Patient had symptoms of sinus infection and impressive physical findings of bulging bilateral TMs.  Will trial amoxicillin, have her follow-up with PCP.  Final Clinical Impressions(s) / ED Diagnoses   Final diagnoses:  None    ED Discharge Orders    None       Abelino DerrickMackuen, Lillyian Heidt Lyn, MD 06/11/17 1528

## 2017-06-27 LAB — OB RESULTS CONSOLE GBS: GBS: POSITIVE

## 2017-06-30 ENCOUNTER — Inpatient Hospital Stay (HOSPITAL_COMMUNITY)
Admission: AD | Admit: 2017-06-30 | Discharge: 2017-07-01 | Disposition: A | Discharge status: Home / Self Care | Payer: BLUE CROSS/BLUE SHIELD | Source: Ambulatory Visit | Attending: Obstetrics & Gynecology | Admitting: Obstetrics & Gynecology

## 2017-06-30 ENCOUNTER — Encounter (HOSPITAL_COMMUNITY): Payer: Self-pay | Admitting: *Deleted

## 2017-06-30 DIAGNOSIS — Z3A26 26 weeks gestation of pregnancy: Secondary | ICD-10-CM | POA: Diagnosis not present

## 2017-06-30 DIAGNOSIS — N760 Acute vaginitis: Secondary | ICD-10-CM

## 2017-06-30 DIAGNOSIS — O23592 Infection of other part of genital tract in pregnancy, second trimester: Secondary | ICD-10-CM

## 2017-06-30 DIAGNOSIS — Z8249 Family history of ischemic heart disease and other diseases of the circulatory system: Secondary | ICD-10-CM | POA: Diagnosis not present

## 2017-06-30 DIAGNOSIS — R109 Unspecified abdominal pain: Secondary | ICD-10-CM | POA: Diagnosis not present

## 2017-06-30 LAB — URINALYSIS, ROUTINE W REFLEX MICROSCOPIC
Bilirubin Urine: NEGATIVE
Glucose, UA: NEGATIVE mg/dL
Ketones, ur: NEGATIVE mg/dL
Nitrite: NEGATIVE
PH: 6 (ref 5.0–8.0)
Protein, ur: NEGATIVE mg/dL
SPECIFIC GRAVITY, URINE: 1.008 (ref 1.005–1.030)
WBC, UA: >50 WBC/hpf — ABNORMAL HIGH (ref 0–5)

## 2017-06-30 NOTE — MAU Provider Note (Signed)
History     CSN: [id number]  Arrival date and time: 06/30/17 2234   First Provider Initiated Contact with Patient 06/30/17 2315      Chief Complaint  Patient presents with  . Abdominal Pain   Pt G1P0 @ 26 weeks presented to MAU for 3 days of vaginal itching, pt was seen at [facility] office for same complaint, dx with vaginitis, given lotrimen OTC cream, but pt stated the pharmacy never received the prescription. Pt used another vaginal cream for itching which has helped a little, pt stated itching feels better today then 3 days ago. Pt stated after she gets out of the shower if feels better, but then worse again later that night. UA was taken and normal, UC was received from office with less than 10,000 colonies of GBS. Pt on amoxicillin, but doesn't know why. Pt also stated she used a new sex toy with BF of years, with no condom used, and unaware of she is allergic to anything. Pt endorses this has never happed before, pt denies change in detergents, lotions, or soaps. Pt denies vaginal discharge, foul smells, fever, last sti test was - 2 months ago, pt stated she has been scratching so much she has minor abrasion lateral to clitoris. Pt denies abdominal pain, leakage of fluids, or cramping, pt endorse +FM.   Abdominal Pain     Past Medical History:  Diagnosis Date  . Medical history non-contributory     Past Surgical History:  Procedure Laterality Date  . TONSILLECTOMY    . WISDOM TOOTH EXTRACTION      Family History  Problem Relation Age of Onset  . Hypertension Mother   . Hypertension Father   . COPD Paternal Aunt     Social History   Tobacco Use  . Smoking status: Never Smoker  . Smokeless tobacco: Never Used  Substance Use Topics  . Alcohol use: Not Currently    Comment: socially  . Drug use: No    Allergies:  Allergies  Allergen Reactions  . Cinnamon     Facial swelling    Medications Prior to Admission  Medication Sig Dispense Refill Last Dose  .  amoxicillin (AMOXIL) 500 MG capsule Take 1 capsule (500 mg total) by mouth 3 (three) times daily. 21 capsule 0   . Prenatal MV-Min-FA-Omega-3 (PRENATAL GUMMIES/DHA & FA PO) Take 2 capsules by mouth daily.   05/23/2017 at Unknown time    Review of Systems  Constitutional: Negative.   HENT: Negative.   Eyes: Negative.   Respiratory: Negative.   Cardiovascular: Negative.   Gastrointestinal: Negative.  Negative for abdominal pain.  Endocrine: Negative.   Genitourinary:       External vaginal pruritis.   Musculoskeletal: Negative.   Allergic/Immunologic: Negative.   Neurological: Negative.   Psychiatric/Behavioral: Negative.   All other systems reviewed and are negative.  Physical Exam   Blood pressure 133/78, pulse 89, temperature 98 F (36.7 C), temperature source Oral, resp. rate 17, last menstrual period 01/08/2017, SpO2 100 %.  Physical Exam  Nursing note and vitals reviewed. Constitutional: She is oriented to person, place, and time. She appears well-developed and well-nourished.  HENT:  Head: Normocephalic.  Eyes: Pupils are equal, round, and reactive to light. Conjunctivae are normal.  Neck: Normal range of motion.  Cardiovascular: Normal rate, regular rhythm and normal heart sounds.  Respiratory: Effort normal and breath sounds normal.  Genitourinary:  Genitourinary Comments: Small vaginal tears, no bleeding, no discoloration on lateral aspects of clitoris.  Musculoskeletal: Normal range of motion.  Neurological: She is alert and oriented to person, place, and time.  Skin: Skin is warm and dry.  Psychiatric: She has a normal mood and affect.    MAU Course  Procedures  MDM: Wet prep, -clue cells, - yeast, - trich, vaginitis, take prescribed cream from office, discharge home.   Assessment and Plan  Vaginitis: Take Prescribed Gyne-Lotrimin 7 1 % vaginal cream  As prescribed and at the pharmacy. Avoid using sex toys, until itching has subsided. Use tucks pad or ice for  itching. If discharge, foul smells occur f/u in office. May use benadryl for itching. No sleeping with underwear. F/U at next scheduled appointment with [facility]. Cat 1 fetal tracing  GBS + in urine from previous visit, will need tx during labor with penicillin.   Rayhaan Huster NP_C 06/30/2017, 11:44 PM

## 2017-06-30 NOTE — MAU Note (Signed)
Pt states she wore lace underwear last Saturday and Sunday noticed some irritation and itching.  Went to the dr was supposed to get a cream but they never  Called it in.  So she went to [facility] and got vaginal itch cream says it helps some but still itching.  No discharge or bleeding.

## 2017-07-01 DIAGNOSIS — O23592 Infection of other part of genital tract in pregnancy, second trimester: Secondary | ICD-10-CM | POA: Diagnosis not present

## 2017-07-01 NOTE — Discharge Instructions (Signed)

## 2017-07-25 ENCOUNTER — Inpatient Hospital Stay (HOSPITAL_COMMUNITY)
Admission: AD | Admit: 2017-07-25 | Discharge: 2017-07-25 | Disposition: A | Discharge status: Home / Self Care | Payer: BLUE CROSS/BLUE SHIELD | Source: Ambulatory Visit | Attending: Obstetrics and Gynecology | Admitting: Obstetrics and Gynecology

## 2017-07-25 ENCOUNTER — Encounter (HOSPITAL_COMMUNITY): Payer: Self-pay | Admitting: *Deleted

## 2017-07-25 DIAGNOSIS — R109 Unspecified abdominal pain: Secondary | ICD-10-CM | POA: Diagnosis not present

## 2017-07-25 DIAGNOSIS — Z3A29 29 weeks gestation of pregnancy: Secondary | ICD-10-CM

## 2017-07-25 DIAGNOSIS — O26893 Other specified pregnancy related conditions, third trimester: Secondary | ICD-10-CM | POA: Insufficient documentation

## 2017-07-25 DIAGNOSIS — O26899 Other specified pregnancy related conditions, unspecified trimester: Secondary | ICD-10-CM

## 2017-07-25 LAB — URINALYSIS, ROUTINE W REFLEX MICROSCOPIC
Bilirubin Urine: NEGATIVE
GLUCOSE, UA: 50 mg/dL — AB
HGB URINE DIPSTICK: NEGATIVE
Ketones, ur: NEGATIVE mg/dL
NITRITE: NEGATIVE
PH: 6 (ref 5.0–8.0)
Protein, ur: 30 mg/dL — AB
SPECIFIC GRAVITY, URINE: 1.021 (ref 1.005–1.030)
WBC, UA: >50 WBC/hpf — ABNORMAL HIGH (ref 0–5)

## 2017-07-25 NOTE — MAU Note (Signed)
Pt states she has been having lower abd cramping for 2 days. Denies bleeding.

## 2017-07-25 NOTE — Discharge Instructions (Signed)

## 2017-07-25 NOTE — MAU Provider Note (Signed)
History     CSN: [id number]  Arrival date and time: 07/25/17 1742   First Provider Initiated Contact with Patient 07/25/17 1825      Chief Complaint  Patient presents with  . Abdominal Pain   HPI Kristina Ortiz is a 27 y.o. G1P0 at 2272w4d who presents with abdominal cramping. Symptoms began 2+ days ago. Was seen in the office on Tuesday by Dr. Normand Sloopillard; cervix was closed. Describes intermittent lower abdominal cramping. Pain occurs every few hours and lasts for 5-10 minutes at a time. Rates pain 5/10 when it occurs. Denies n/v/d, constipation, dysuria, hematuria, vaginal bleeding, LOF, or vaginal discharge. Had intercourse last night. Positive fetal movement.  Admits to only drinking 3 cups of water today.   OB History    Gravida  1   Para      Term      Preterm      AB      Living        SAB      TAB      Ectopic      Multiple      Live Births              Past Medical History:  Diagnosis Date  . Medical history non-contributory     Past Surgical History:  Procedure Laterality Date  . TONSILLECTOMY    . WISDOM TOOTH EXTRACTION      Family History  Problem Relation Age of Onset  . Hypertension Mother   . Hypertension Father   . COPD Paternal Aunt     Social History   Tobacco Use  . Smoking status: Never Smoker  . Smokeless tobacco: Never Used  Substance Use Topics  . Alcohol use: Not Currently    Comment: socially  . Drug use: No    Allergies:  Allergies  Allergen Reactions  . Cinnamon     Facial swelling    Medications Prior to Admission  Medication Sig Dispense Refill Last Dose  . Prenatal MV-Min-FA-Omega-3 (PRENATAL GUMMIES/DHA & FA PO) Take 2 capsules by mouth daily.   05/23/2017 at Unknown time    Review of Systems  Constitutional: Negative.   Gastrointestinal: Positive for abdominal pain. Negative for constipation, diarrhea, nausea and vomiting.  Genitourinary: Negative.    Physical Exam   Blood pressure 129/64, pulse 96,  temperature 98.9 F (37.2 C), temperature source Oral, resp. rate 15, height 5\' 6"  (1.676 m), weight 206 lb (93.4 kg), last menstrual period 01/08/2017, SpO2 100 %.  Physical Exam  Nursing note and vitals reviewed. Constitutional: She is oriented to person, place, and time. She appears well-developed and well-nourished. No distress.  HENT:  Head: Normocephalic and atraumatic.  Eyes: Conjunctivae are normal. Right eye exhibits no discharge. Left eye exhibits no discharge. No scleral icterus.  Neck: Normal range of motion.  Respiratory: Effort normal. No respiratory distress.  GI: Soft. Bowel sounds are normal. There is no tenderness.  Genitourinary:  Genitourinary Comments: Dilation: Closed Effacement (%): Thick Cervical Position: Posterior Exam by:: Judeth HornErin Tyshay Adee, NP   Neurological: She is alert and oriented to person, place, and time.  Skin: Skin is warm and dry. She is not diaphoretic.  Psychiatric: She has a normal mood and affect. Her behavior is normal. Judgment and thought content normal.    MAU Course  Procedures Results for orders placed or performed during the hospital encounter of 07/25/17 (from the past 24 hour(s))  Urinalysis, Routine w reflex microscopic     Status:  Abnormal   Collection Time: 07/25/17  6:22 PM  Result Value Ref Range   Color, Urine YELLOW YELLOW   APPearance CLOUDY (A) CLEAR   Specific Gravity, Urine 1.021 1.005 - 1.030   pH 6.0 5.0 - 8.0   Glucose, UA 50 (A) NEGATIVE mg/dL   Hgb urine dipstick NEGATIVE NEGATIVE   Bilirubin Urine NEGATIVE NEGATIVE   Ketones, ur NEGATIVE NEGATIVE mg/dL   Protein, ur 30 (A) NEGATIVE mg/dL   Nitrite NEGATIVE NEGATIVE   Leukocytes, UA LARGE (A) NEGATIVE   RBC / HPF 21-50 0 - 5 RBC/hpf   WBC, UA >50 (H) 0 - 5 WBC/hpf   Bacteria, UA RARE (A) NONE SEEN   Squamous Epithelial / LPF 11-20 0 - 5   Mucus PRESENT    Ca Oxalate Crys, UA PRESENT     MDM NST:  Baseline: 145 bpm, Variability: Good {> 6 bpm),  Accelerations: Reactive, Decelerations: Absent and no contractions  No ctx palpated or on toco. Cervix closed/thick.  Unable to collect FFN d/t recent intercourse.  U/a results similar to previous collection a few weeks ago. Pt denies any urinary complaints. Will send urine for culture.   C/w Dr. Su Hilt. Informed of presentation, assessment, FHT, and u/a. Ok to discharge home. Agrees with plan to culture urine. Pt to keep f/u in office.   Assessment and Plan  A: 1. Abdominal cramping affecting pregnancy, antepartum   2. [redacted] weeks gestation of pregnancy    P: Discharge home Urine culture pending Discussed reasons to return to MAU including s/s PTL or UTI Keep f/u in the office  Judeth Horn 07/25/2017, 6:25 PM

## 2017-07-26 LAB — CULTURE, OB URINE: Culture: 80000 — AB

## 2017-08-01 DIAGNOSIS — J069 Acute upper respiratory infection, unspecified: Secondary | ICD-10-CM | POA: Diagnosis not present

## 2017-08-04 DIAGNOSIS — J0100 Acute maxillary sinusitis, unspecified: Secondary | ICD-10-CM | POA: Diagnosis not present

## 2017-08-06 ENCOUNTER — Encounter: Payer: BLUE CROSS/BLUE SHIELD | Attending: Obstetrics and Gynecology | Admitting: Registered"

## 2017-08-06 DIAGNOSIS — O24419 Gestational diabetes mellitus in pregnancy, unspecified control: Secondary | ICD-10-CM | POA: Insufficient documentation

## 2017-08-06 DIAGNOSIS — Z713 Dietary counseling and surveillance: Secondary | ICD-10-CM | POA: Diagnosis not present

## 2017-08-06 DIAGNOSIS — O99810 Abnormal glucose complicating pregnancy: Secondary | ICD-10-CM

## 2017-08-07 ENCOUNTER — Encounter: Payer: Self-pay | Admitting: Registered"

## 2017-08-07 DIAGNOSIS — O99810 Abnormal glucose complicating pregnancy: Secondary | ICD-10-CM | POA: Insufficient documentation

## 2017-08-07 NOTE — Progress Notes (Signed)
Patient was seen on 08/06/17 for Gestational Diabetes self-management class at the [facility]. The following learning objectives were met by the patient during this course:   States the definition of Gestational Diabetes  States why dietary management is important in controlling blood glucose  Describes the effects each nutrient has on blood glucose levels  Demonstrates ability to create a balanced meal plan  Demonstrates carbohydrate counting   States when to check blood glucose levels  Demonstrates proper blood glucose monitoring techniques  States the effect of stress and exercise on blood glucose levels  States the importance of limiting caffeine and abstaining from alcohol and smoking  Blood glucose monitor given: Accu-chek Guide Lot # [id number] Exp: 10/08/17 Blood glucose reading: 112  Patient instructed to monitor glucose levels: FBS: 60 - <95; 1 hour: <140; 2 hour: <120  Patient received handouts:  Nutrition Diabetes and Pregnancy, including carb counting list  Patient will be seen for follow-up as needed.

## 2017-08-28 ENCOUNTER — Inpatient Hospital Stay (HOSPITAL_COMMUNITY)
Admission: AD | Admit: 2017-08-28 | Discharge: 2017-08-28 | Disposition: A | Discharge status: Home / Self Care | Payer: BLUE CROSS/BLUE SHIELD | Source: Ambulatory Visit | Attending: Obstetrics & Gynecology | Admitting: Obstetrics & Gynecology

## 2017-08-28 ENCOUNTER — Encounter (HOSPITAL_COMMUNITY): Payer: Self-pay | Admitting: *Deleted

## 2017-08-28 DIAGNOSIS — Z3A34 34 weeks gestation of pregnancy: Secondary | ICD-10-CM | POA: Diagnosis not present

## 2017-08-28 DIAGNOSIS — O26893 Other specified pregnancy related conditions, third trimester: Secondary | ICD-10-CM | POA: Insufficient documentation

## 2017-08-28 DIAGNOSIS — Z8249 Family history of ischemic heart disease and other diseases of the circulatory system: Secondary | ICD-10-CM | POA: Diagnosis not present

## 2017-08-28 DIAGNOSIS — R109 Unspecified abdominal pain: Secondary | ICD-10-CM | POA: Insufficient documentation

## 2017-08-28 DIAGNOSIS — O26899 Other specified pregnancy related conditions, unspecified trimester: Secondary | ICD-10-CM

## 2017-08-28 DIAGNOSIS — N939 Abnormal uterine and vaginal bleeding, unspecified: Secondary | ICD-10-CM | POA: Diagnosis not present

## 2017-08-28 LAB — URINALYSIS, ROUTINE W REFLEX MICROSCOPIC
BACTERIA UA: NONE SEEN
BILIRUBIN URINE: NEGATIVE
Glucose, UA: 50 mg/dL — AB
KETONES UR: NEGATIVE mg/dL
Nitrite: NEGATIVE
PH: 6 (ref 5.0–8.0)
Protein, ur: 30 mg/dL — AB
SPECIFIC GRAVITY, URINE: 1.021 (ref 1.005–1.030)

## 2017-08-28 NOTE — Discharge Instructions (Signed)

## 2017-08-28 NOTE — MAU Provider Note (Signed)
Chief Complaint:  Vaginal Bleeding   None    HPI: Kristina Ortiz is a 27 y.o. G1P0 at 4354w3d who presents to maternity admissions reporting spotting and some pressure. Denies leaking of fluid.  No intercourse.  FM+  Location: vaginal  Quality: sm spotting Severity: 4/10 in pain scale Duration: today  Pregnancy Course:   Past Medical History:  Diagnosis Date  . Medical history non-contributory    OB History  Gravida Para Term Preterm AB Living  1            SAB TAB Ectopic Multiple Live Births               # Outcome Date GA Lbr Len/2nd Weight Sex Delivery Anes PTL Lv  1 Current            Past Surgical History:  Procedure Laterality Date  . TONSILLECTOMY    . WISDOM TOOTH EXTRACTION     Family History  Problem Relation Age of Onset  . Hypertension Mother   . Hypertension Father   . COPD Paternal Aunt    Social History   Tobacco Use  . Smoking status: Never Smoker  . Smokeless tobacco: Never Used  Substance Use Topics  . Alcohol use: Not Currently    Comment: socially  . Drug use: No   Allergies  Allergen Reactions  . Cinnamon     Facial swelling   Medications Prior to Admission  Medication Sig Dispense Refill Last Dose  . ferrous sulfate 325 (65 FE) MG EC tablet Take 325 mg by mouth 3 (three) times daily with meals.   08/28/2017 at Unknown time  . penicillin v potassium (VEETID) 500 MG tablet Take 500 mg by mouth 3 (three) times daily.     . Prenatal MV-Min-FA-Omega-3 (PRENATAL GUMMIES/DHA & FA PO) Take 2 capsules by mouth daily.   08/28/2017 at Unknown time    I have reviewed patient's Past Medical Hx, Surgical Hx, Family Hx, Social Hx, medications and allergies.   ROS:  Review of Systems  Constitutional: Negative.   HENT: Negative.   Eyes: Negative.   Respiratory: Negative.   Cardiovascular: Negative.   Gastrointestinal: Positive for abdominal pain.  Endocrine: Negative.   Genitourinary: Negative.   Musculoskeletal: Negative.   Skin: Negative.    Allergic/Immunologic: Negative.   Neurological: Negative.   Hematological: Negative.   Psychiatric/Behavioral: Negative.     Physical Exam   Patient Vitals for the past 24 hrs:  BP Temp Temp src Pulse Resp Height Weight  08/28/17 2100 123/70 98.1 F (36.7 C) Oral 79 20 5\' 6"  (1.676 m) 94.3 kg (208 lb)   Constitutional: Well-developed, well-nourished female in no acute distress.  Cardiovascular: normal rate Respiratory: normal effort GI: Abd soft, non-tender, gravid appropriate for gestational age. Pos BS x 4 MS: Extremities nontender, no edema, normal ROM Neurologic: Alert and oriented x 4.  GU: Neg CVAT.  Pelvic: NEFG, physiologic discharge, no blood, cervix clean. No CMT  Dilation: Closed Effacement (%): Thick Exam by:: Bernerd PhoNancy Prothero CNM  FHT:  Baseline 125 , moderate variability, accelerations present, no decelerations Contractions:None   Labs: Results for orders placed or performed during the hospital encounter of 08/28/17 (from the past 24 hour(s))  Urinalysis, Routine w reflex microscopic     Status: Abnormal   Collection Time: 08/28/17  9:20 PM  Result Value Ref Range   Color, Urine YELLOW YELLOW   APPearance HAZY (A) CLEAR   Specific Gravity, Urine 1.021 1.005 - 1.030  pH 6.0 5.0 - 8.0   Glucose, UA 50 (A) NEGATIVE mg/dL   Hgb urine dipstick SMALL (A) NEGATIVE   Bilirubin Urine NEGATIVE NEGATIVE   Ketones, ur NEGATIVE NEGATIVE mg/dL   Protein, ur 30 (A) NEGATIVE mg/dL   Nitrite NEGATIVE NEGATIVE   Leukocytes, UA SMALL (A) NEGATIVE   RBC / HPF 0-5 0 - 5 RBC/hpf   WBC, UA 6-10 0 - 5 WBC/hpf   Bacteria, UA NONE SEEN NONE SEEN   Squamous Epithelial / LPF 6-10 0 - 5   Mucus PRESENT     Imaging:  No results found.  MAU Course: Orders Placed This Encounter  Procedures  . Urinalysis, Routine w reflex microscopic  . Discharge patient Discharge disposition: 01-Home or Self Care; Discharge patient date: 08/28/2017   No orders of the defined types were  placed in this encounter.   MDM: PE and UA and speculum.  Discharge home with precautions Assessment: 1. Abdominal pain affecting pregnancy, antepartum     Plan: Discharge home in stable condition.   Labor precautions and fetal kick counts Follow-up Information    [facility] Follow up in 1 week(s).   Specialty:  Obstetrics and Gynecology Contact information: [address] [place] [address] [phone]          Allergies as of 08/28/2017      Reactions   Cinnamon    Facial swelling      Medication List    STOP taking these medications   penicillin v potassium 500 MG tablet Commonly known as:  VEETID     TAKE these medications   ferrous sulfate 325 (65 FE) MG EC tablet Take 325 mg by mouth 3 (three) times daily with meals.   PRENATAL GUMMIES/DHA & FA PO Take 2 capsules by mouth daily.       Kenney Houseman, CNM 08/28/2017 10:07 PM

## 2017-08-28 NOTE — MAU Note (Signed)
PT SAYS  SHE WENT TO B-ROOM AT 445PM WHILE  AT LUNCH  AT WORK.   WHEN WIPED  - SAW REDDISH PINK BLOOD .    THEN SAW AGAIN AT 730 PM.   FEELS  CRAMPING  AND PRESSURE - SINCE LAST WEEK.   LAST SEX-  Tuesday.  [facility]- WITH  [facility]- SAW KULWA- . LAST VISIT

## 2017-08-28 NOTE — MAU Note (Signed)
Pt reports lower abdominal pain and cramping that started last night. Pt reports some spotting that she noticed today. Reports good fetal movement. Pt reports not taking anything for pain. Rates pain 8/10.

## 2017-08-30 DIAGNOSIS — J069 Acute upper respiratory infection, unspecified: Secondary | ICD-10-CM | POA: Diagnosis not present

## 2017-09-12 DIAGNOSIS — R112 Nausea with vomiting, unspecified: Secondary | ICD-10-CM | POA: Diagnosis not present

## 2017-09-17 ENCOUNTER — Encounter (HOSPITAL_COMMUNITY): Payer: Self-pay | Admitting: *Deleted

## 2017-09-17 ENCOUNTER — Telehealth (HOSPITAL_COMMUNITY): Payer: Self-pay | Admitting: *Deleted

## 2017-09-17 NOTE — Telephone Encounter (Signed)
Preadmission screen  

## 2017-09-18 ENCOUNTER — Telehealth (HOSPITAL_COMMUNITY): Payer: Self-pay | Admitting: *Deleted

## 2017-09-18 NOTE — Telephone Encounter (Signed)
Preadmission screen  

## 2017-09-19 ENCOUNTER — Telehealth (HOSPITAL_COMMUNITY): Payer: Self-pay | Admitting: *Deleted

## 2017-09-19 NOTE — Telephone Encounter (Signed)
Preadmission screen  

## 2017-09-24 ENCOUNTER — Other Ambulatory Visit: Payer: Self-pay | Admitting: Obstetrics and Gynecology

## 2017-09-29 ENCOUNTER — Inpatient Hospital Stay (HOSPITAL_COMMUNITY)
Admission: RE | Admit: 2017-09-29 | Discharge: 2017-10-02 | DRG: 807 | Disposition: A | Discharge status: Home / Self Care | Payer: BLUE CROSS/BLUE SHIELD | Attending: Obstetrics and Gynecology | Admitting: Obstetrics and Gynecology

## 2017-09-29 ENCOUNTER — Inpatient Hospital Stay (HOSPITAL_COMMUNITY): Payer: BLUE CROSS/BLUE SHIELD | Admitting: Anesthesiology

## 2017-09-29 ENCOUNTER — Encounter (HOSPITAL_COMMUNITY): Payer: Self-pay

## 2017-09-29 DIAGNOSIS — D649 Anemia, unspecified: Secondary | ICD-10-CM | POA: Diagnosis present

## 2017-09-29 DIAGNOSIS — O9902 Anemia complicating childbirth: Secondary | ICD-10-CM | POA: Diagnosis not present

## 2017-09-29 DIAGNOSIS — O99824 Streptococcus B carrier state complicating childbirth: Secondary | ICD-10-CM | POA: Diagnosis present

## 2017-09-29 DIAGNOSIS — Z3490 Encounter for supervision of normal pregnancy, unspecified, unspecified trimester: Secondary | ICD-10-CM

## 2017-09-29 DIAGNOSIS — O24425 Gestational diabetes mellitus in childbirth, controlled by oral hypoglycemic drugs: Secondary | ICD-10-CM | POA: Diagnosis not present

## 2017-09-29 DIAGNOSIS — O134 Gestational [pregnancy-induced] hypertension without significant proteinuria, complicating childbirth: Secondary | ICD-10-CM | POA: Diagnosis not present

## 2017-09-29 DIAGNOSIS — Z3A39 39 weeks gestation of pregnancy: Secondary | ICD-10-CM

## 2017-09-29 LAB — TYPE AND SCREEN
ABO/RH(D): O POS
ANTIBODY SCREEN: NEGATIVE

## 2017-09-29 LAB — CBC
HEMATOCRIT: 34.6 % — AB (ref 36.0–46.0)
Hemoglobin: 11.6 g/dL — ABNORMAL LOW (ref 12.0–15.0)
MCH: 27.2 pg (ref 26.0–34.0)
MCHC: 33.5 g/dL (ref 30.0–36.0)
MCV: 81.2 fL (ref 78.0–100.0)
Platelets: 190 10*3/uL (ref 150–400)
RBC: 4.26 MIL/uL (ref 3.87–5.11)
RDW: 16.6 % — AB (ref 11.5–15.5)
WBC: 7 10*3/uL (ref 4.0–10.5)

## 2017-09-29 LAB — RPR: RPR Ser Ql: NONREACTIVE

## 2017-09-29 LAB — GLUCOSE, CAPILLARY
Glucose-Capillary: 149 mg/dL — ABNORMAL HIGH (ref 70–99)
Glucose-Capillary: 62 mg/dL — ABNORMAL LOW (ref 70–99)
Glucose-Capillary: 95 mg/dL (ref 70–99)

## 2017-09-29 LAB — ABO/RH: ABO/RH(D): O POS

## 2017-09-29 MED ORDER — ONDANSETRON HCL 4 MG/2ML IJ SOLN: 4.0000 mg | Freq: Four times a day (QID) | INTRAMUSCULAR | Status: DC | PRN

## 2017-09-29 MED ORDER — OXYTOCIN BOLUS FROM INFUSION
500.0000 mL | Freq: Once | INTRAVENOUS | Status: AC
  Administered 2017-09-30: 500 mL via INTRAVENOUS

## 2017-09-29 MED ORDER — DIPHENHYDRAMINE HCL 50 MG/ML IJ SOLN: 12.5000 mg | INTRAMUSCULAR | Status: DC | PRN

## 2017-09-29 MED ORDER — SOD CITRATE-CITRIC ACID 500-334 MG/5ML PO SOLN: 30.0000 mL | ORAL | Status: DC | PRN

## 2017-09-29 MED ORDER — MISOPROSTOL 25 MCG QUARTER TABLET
25.0000 ug | ORAL_TABLET | ORAL | Status: DC
  Administered 2017-09-29 (×2): 25 ug via VAGINAL
  Filled 2017-09-29 (×8): qty 1

## 2017-09-29 MED ORDER — EPHEDRINE 5 MG/ML INJ
10.0000 mg | INTRAVENOUS | Status: DC | PRN
  Filled 2017-09-29: qty 2

## 2017-09-29 MED ORDER — TERBUTALINE SULFATE 1 MG/ML IJ SOLN
0.2500 mg | Freq: Once | INTRAMUSCULAR | Status: DC | PRN
  Filled 2017-09-29: qty 1

## 2017-09-29 MED ORDER — OXYTOCIN 40 UNITS IN LACTATED RINGERS INFUSION - SIMPLE MED
1.0000 m[IU]/min | INTRAVENOUS | Status: DC
  Administered 2017-09-29: 2 m[IU]/min via INTRAVENOUS

## 2017-09-29 MED ORDER — PHENYLEPHRINE 40 MCG/ML (10ML) SYRINGE FOR IV PUSH (FOR BLOOD PRESSURE SUPPORT)
80.0000 ug | PREFILLED_SYRINGE | INTRAVENOUS | Status: DC | PRN
  Filled 2017-09-29: qty 5

## 2017-09-29 MED ORDER — PHENYLEPHRINE 40 MCG/ML (10ML) SYRINGE FOR IV PUSH (FOR BLOOD PRESSURE SUPPORT)
80.0000 ug | PREFILLED_SYRINGE | INTRAVENOUS | Status: DC | PRN
  Filled 2017-09-29: qty 5
  Filled 2017-09-29: qty 10

## 2017-09-29 MED ORDER — OXYCODONE-ACETAMINOPHEN 5-325 MG PO TABS: 1.0000 | ORAL_TABLET | ORAL | Status: DC | PRN

## 2017-09-29 MED ORDER — FENTANYL 2.5 MCG/ML BUPIVACAINE 1/10 % EPIDURAL INFUSION (WH - ANES)
14.0000 mL/h | INTRAMUSCULAR | Status: DC | PRN
  Administered 2017-09-30 (×2): 14 mL/h via EPIDURAL
  Filled 2017-09-29 (×2): qty 100

## 2017-09-29 MED ORDER — LACTATED RINGERS IV SOLN
500.0000 mL | Freq: Once | INTRAVENOUS | Status: AC
  Administered 2017-09-29: 500 mL via INTRAVENOUS

## 2017-09-29 MED ORDER — LACTATED RINGERS IV SOLN: 500.0000 mL | INTRAVENOUS | Status: DC | PRN

## 2017-09-29 MED ORDER — SODIUM CHLORIDE 0.9 % IV SOLN
5.0000 10*6.[IU] | Freq: Once | INTRAVENOUS | Status: AC
  Administered 2017-09-29: 5 10*6.[IU] via INTRAVENOUS
  Filled 2017-09-29: qty 5

## 2017-09-29 MED ORDER — LACTATED RINGERS IV SOLN
INTRAVENOUS | Status: DC
  Administered 2017-09-29: 09:00:00 via INTRAVENOUS

## 2017-09-29 MED ORDER — OXYTOCIN 40 UNITS IN LACTATED RINGERS INFUSION - SIMPLE MED
2.5000 [IU]/h | INTRAVENOUS | Status: DC
  Filled 2017-09-29: qty 1000

## 2017-09-29 MED ORDER — LIDOCAINE HCL (PF) 1 % IJ SOLN
30.0000 mL | INTRAMUSCULAR | Status: DC | PRN
  Filled 2017-09-29: qty 30

## 2017-09-29 MED ORDER — ACETAMINOPHEN 325 MG PO TABS
650.0000 mg | ORAL_TABLET | ORAL | Status: DC | PRN
  Filled 2017-09-29: qty 2

## 2017-09-29 MED ORDER — PENICILLIN G 3 MILLION UNITS IVPB - SIMPLE MED
3.0000 10*6.[IU] | INTRAVENOUS | Status: DC
  Administered 2017-09-29 – 2017-09-30 (×6): 3 10*6.[IU] via INTRAVENOUS
  Filled 2017-09-29 (×2): qty 3
  Filled 2017-09-29 (×6): qty 100
  Filled 2017-09-29: qty 3
  Filled 2017-09-29 (×2): qty 100
  Filled 2017-09-29: qty 3
  Filled 2017-09-29 (×2): qty 100

## 2017-09-29 MED ORDER — OXYCODONE-ACETAMINOPHEN 5-325 MG PO TABS: 2.0000 | ORAL_TABLET | ORAL | Status: DC | PRN

## 2017-09-29 MED FILL — Penicillin G Potassium For Inj 5000000 Unit: INTRAMUSCULAR | Qty: 3 | Status: AC

## 2017-09-29 MED FILL — Sodium Chloride IV Soln 0.9%: INTRAVENOUS | Qty: 100 | Status: AC

## 2017-09-29 NOTE — Progress Notes (Signed)
Subjective: Pt is still comfortable.  Denies need for pain medication.  Objective: BP 137/80   Pulse 75   Temp 98.1 F (36.7 C) (Oral)   Resp 15   Ht 5\' 6"  (1.676 m)   Wt 95.4 kg   LMP 01/08/2017   BMI 33.96 kg/m  No intake/output data recorded. No intake/output data recorded.  FHT: Category 1 FHT 120 accels, no decels,  UC:   regular, every 3 minutes SVE:   Dilation: 3 Effacement (%): 70 Station: -1 Exam by:: Fabio NeighborsErin MacDonald RN  Pitocin at 5 mu AROM clear fluid  Assessment:  G1P0 at 39 weeks IUP induction for GDMA2 Cat 1   Plan: Epidural when uncomfortable. Anticipate SVD  Kenney HousemanNancy Jean Prothero CNM, MSN 09/29/2017, 10:45 PM

## 2017-09-29 NOTE — H&P (Signed)
Kristina Ortiz is a 27 y.o. female ,G1P0,presenting for IOL at 30 weeks for Class A2 DB.  Pregnancy followed at [facility] since 21+3  weeks and remarkable for:  1. Anemia: current Hgb11.6 2. Class A2 DB on Glyburide 5 mg am with good control. Last EFW 6 lbs 14 oz  76% at 36 weeks 3. GBS+  OB History    Gravida  1   Para      Term      Preterm      AB      Living        SAB      TAB      Ectopic      Multiple      Live Births             Past Medical History:  Diagnosis Date  . Late prenatal care   . Medical history non-contributory   . Vaginal Pap smear, abnormal    Past Surgical History:  Procedure Laterality Date  . TONSILLECTOMY    . WISDOM TOOTH EXTRACTION      Family History:   family history includes COPD in her paternal aunt; Hypertension in her father and mother. Social History:    reports that she has never smoked. She has never used smokeless tobacco. She reports that she drank alcohol. She reports that she does not use drugs.   Prenatal labs: ABO, Rh: O/Positive/-- (03/13 0000) Antibody: Negative (03/13 0000) Rubella: Immune (03/13 0000) RPR: Nonreactive (03/13 0000)  HBsAg: Negative (03/13 0000)  HIV: Non-reactive (03/13 0000)  GBS: Positive (05/10 0000)    Prenatal Transfer Tool  Maternal Diabetes: Yes:  Diabetes Type:  Insulin/Medication controlled Glyburide 5 mg am Genetic Screening: Normal Quad screen Maternal Ultrasounds/Referrals: Normal Fetal Ultrasounds or other Referrals:  None Maternal Substance Abuse:  No Significant Maternal Medications:  Meds include: Other:  Glyburide 5 mg am Significant Maternal Lab Results: None   Dilation: 1.5 Effacement (%): 60 Station: -3 Exam by:: J.Thornton, rn  Blood pressure (!) 143/96, pulse 75, temperature 98.3 F (36.8 C), temperature source Oral, resp. rate 16, height 5\' 6"  (1.676 m), weight 95.4 kg, last menstrual period 01/08/2017.   BP rechecked 138/77. Patient denies PIH  symptoms  General Appearance: Alert, appropriate appearance for age. No acute distress HEENT Exam: Grossly normal Chest/Respiratory Exam: Normal chest wall and respirations. Clear to auscultation  Cardiovascular Exam: Regular rate and rhythm. S1, S2, no murmur Gastrointestinal Exam: soft, non-tender, Uterus gravid with size compatible with GA, Vertex presentation by Leopold's maneuvers Psychiatric Exam: Alert and oriented, appropriate affect Lower extremities: 1+ edema ne evidence of DVT  ++++++++++++++++++++++++++++++++++++++++++++++++++++++++++++++++  Fetal tracings: Category 1  ++++++++++++++++++++++++++++++++++++++++++++++++++++++++++++++++   Assessment/Plan:  IUP at 39 weeks with well controlled Class A2 diabetes for IOL Reviewed plan of care with patient. Questions answered. Patient desires epidural for delivery   Silverio LaySandra Maanvi Lecompte MD 09/29/2017, 9:05 AM

## 2017-09-29 NOTE — Anesthesia Pain Management Evaluation Note (Signed)
  CRNA Pain Management Visit Note  Patient: Kristina Ortiz, 27 y.o., female  "Hello I am a member of the anesthesia team at [facility]. We have an anesthesia team available at all times to provide care throughout the hospital, including epidural management and anesthesia for C-section. I don't know your plan for the delivery whether it a natural birth, water birth, IV sedation, nitrous supplementation, doula or epidural, but we want to meet your pain goals."   1.Was your pain managed to your expectations on prior hospitalizations?   No prior hospitalizations  2.What is your expectation for pain management during this hospitalization?     Epidural  3.How can we help you reach that goal? unsure  Record the patient's initial score and the patient's pain goal.   Pain: 0  Pain Goal: 6 [facility] wants you to be able to say your pain was always managed very well.  Cephus ShellingBURGER,[place] 09/29/2017

## 2017-09-29 NOTE — Anesthesia Preprocedure Evaluation (Signed)

## 2017-09-29 NOTE — Progress Notes (Signed)
Kristina Ortiz is a 27 y.o. G1P0 at 2339w0dadmitted for induction of labor due to Gestational diabetes.  Subjective:  Comfortable with  Labor support without medications Contractions irregular and painless with 1st dose of cytotec    Objective: BP 135/79   Pulse 85   Temp 98.3 F (36.8 C) (Oral)   Resp 18   Ht 5\' 6"  (1.676 m)   Wt 95.4 kg   LMP 01/08/2017   BMI 33.96 kg/m  CBG 149 at 10:00  YNW:GNFAOZHYFHT:Category 1 SVE:   1 cm/60%/-2  Labs: Lab Results  Component Value Date   WBC 7.0 09/29/2017   HGB 11.6 (L) 09/29/2017   HCT 34.6 (L) 09/29/2017   MCV 81.2 09/29/2017   PLT 190 09/29/2017    Assessment / Plan:  Fetal Wellbeing: reassuring Anticipated MOD:  NSVD 2nd dose of Cytotec placed per vagina  Kristina Ortiz 09/29/2017, 12:39 PM

## 2017-09-29 NOTE — Progress Notes (Signed)
Kristina PlummerKyeria Klonowski is a 27 y.o. G1P0 at 1439w0dadmitted for induction of labor due to Gestational diabetes.  Subjective:  Comfortable with  Labor support without medications Contractions are still irregular after 2 doses of Cytotec    Objective: BP (!) 144/83   Pulse 77   Temp 99.4 F (37.4 C) (Oral)   Resp 17   Ht 5\' 6"  (1.676 m)   Wt 95.4 kg   LMP 01/08/2017   BMI 33.96 kg/m  No intake/output data recorded. No intake/output data recorded.  FHT:  Category 1 SVE:   Dilation: 3 Effacement (%): 60 Station: -2 Exam by:: Dr. Estanislado Pandyivard  Labs: Lab Results  Component Value Date   WBC 7.0 09/29/2017   HGB 11.6 (L) 09/29/2017   HCT 34.6 (L) 09/29/2017   MCV 81.2 09/29/2017   PLT 190 09/29/2017    Assessment / Plan: 39 weeks A2 diabetes for IOL s/p 2 doses of Cytotec Start Pitocin Plan AROM Fetal Wellbeing: reassuring Anticipated MOD:  NSVD  Dois DavenportSandra A Soumya Colson 09/29/2017, 5:17 PM

## 2017-09-30 ENCOUNTER — Encounter (HOSPITAL_COMMUNITY): Payer: Self-pay

## 2017-09-30 LAB — COMPREHENSIVE METABOLIC PANEL
ALBUMIN: 2.8 g/dL — AB (ref 3.5–5.0)
ALK PHOS: 137 U/L — AB (ref 38–126)
ALT: 19 U/L (ref 0–44)
AST: 22 U/L (ref 15–41)
Anion gap: 11 (ref 5–15)
BILIRUBIN TOTAL: 0.6 mg/dL (ref 0.3–1.2)
BUN: 6 mg/dL (ref 6–20)
CALCIUM: 8.7 mg/dL — AB (ref 8.9–10.3)
CO2: 18 mmol/L — ABNORMAL LOW (ref 22–32)
CREATININE: 0.74 mg/dL (ref 0.44–1.00)
Chloride: 106 mmol/L (ref 98–111)
GFR calc Af Amer: >60 mL/min (ref >60)
GFR calc non Af Amer: >60 mL/min (ref >60)
GLUCOSE: 95 mg/dL (ref 70–99)
Potassium: 4.2 mmol/L (ref 3.5–5.1)
Sodium: 135 mmol/L (ref 135–145)
TOTAL PROTEIN: 6.4 g/dL — AB (ref 6.5–8.1)

## 2017-09-30 LAB — GLUCOSE, CAPILLARY
GLUCOSE-CAPILLARY: 143 mg/dL — AB (ref 70–99)
Glucose-Capillary: 91 mg/dL (ref 70–99)

## 2017-09-30 LAB — CBC WITH DIFFERENTIAL/PLATELET
BASOS PCT: 0 %
Basophils Absolute: 0 10*3/uL (ref 0.0–0.1)
Eosinophils Absolute: 0.1 10*3/uL (ref 0.0–0.7)
Eosinophils Relative: 1 %
HEMATOCRIT: 35.7 % — AB (ref 36.0–46.0)
HEMOGLOBIN: 12 g/dL (ref 12.0–15.0)
LYMPHS ABS: 1.4 10*3/uL (ref 0.7–4.0)
Lymphocytes Relative: 18 %
MCH: 27.6 pg (ref 26.0–34.0)
MCHC: 33.6 g/dL (ref 30.0–36.0)
MCV: 82.1 fL (ref 78.0–100.0)
MONOS PCT: 9 %
Monocytes Absolute: 0.7 10*3/uL (ref 0.1–1.0)
NEUTROS ABS: 5.9 10*3/uL (ref 1.7–7.7)
NEUTROS PCT: 72 %
Platelets: 210 10*3/uL (ref 150–400)
RBC: 4.35 MIL/uL (ref 3.87–5.11)
RDW: 16.6 % — ABNORMAL HIGH (ref 11.5–15.5)
WBC: 8.2 10*3/uL (ref 4.0–10.5)

## 2017-09-30 LAB — PROTEIN / CREATININE RATIO, URINE
Creatinine, Urine: 111 mg/dL
Protein Creatinine Ratio: 0.27 mg/mg{Cre} — ABNORMAL HIGH (ref 0.00–0.15)
Total Protein, Urine: 30 mg/dL

## 2017-09-30 LAB — URIC ACID: Uric Acid, Serum: 4.6 mg/dL (ref 2.5–7.1)

## 2017-09-30 LAB — LACTATE DEHYDROGENASE: LDH: 191 U/L (ref 98–192)

## 2017-09-30 MED ORDER — MISOPROSTOL 200 MCG PO TABS
800.0000 ug | ORAL_TABLET | Freq: Once | ORAL | Status: AC
  Administered 2017-09-30: 800 ug via ORAL

## 2017-09-30 MED ORDER — MISOPROSTOL 200 MCG PO TABS
ORAL_TABLET | ORAL | Status: AC
  Filled 2017-09-30: qty 4

## 2017-09-30 MED ORDER — IBUPROFEN 600 MG PO TABS
600.0000 mg | ORAL_TABLET | Freq: Four times a day (QID) | ORAL | Status: DC
  Administered 2017-09-30 – 2017-10-02 (×6): 600 mg via ORAL
  Filled 2017-09-30 (×7): qty 1

## 2017-09-30 MED ORDER — TETANUS-DIPHTH-ACELL PERTUSSIS 5-2.5-18.5 LF-MCG/0.5 IM SUSP: 0.5000 mL | Freq: Once | INTRAMUSCULAR | Status: DC

## 2017-09-30 MED ORDER — LACTATED RINGERS IV SOLN
INTRAVENOUS | Status: DC
  Administered 2017-09-30 (×2): via INTRAVENOUS

## 2017-09-30 MED ORDER — ZOLPIDEM TARTRATE 5 MG PO TABS: 5.0000 mg | ORAL_TABLET | Freq: Every evening | ORAL | Status: DC | PRN

## 2017-09-30 MED ORDER — BENZOCAINE-MENTHOL 20-0.5 % EX AERO
1.0000 "application " | INHALATION_SPRAY | CUTANEOUS | Status: DC | PRN
  Administered 2017-09-30: 1 via TOPICAL
  Filled 2017-09-30: qty 56

## 2017-09-30 MED ORDER — PRENATAL MULTIVITAMIN CH
1.0000 | ORAL_TABLET | Freq: Every day | ORAL | Status: DC
  Administered 2017-10-01: 1 via ORAL
  Filled 2017-09-30: qty 1

## 2017-09-30 MED ORDER — ONDANSETRON HCL 4 MG PO TABS: 4.0000 mg | ORAL_TABLET | ORAL | Status: DC | PRN

## 2017-09-30 MED ORDER — COCONUT OIL OIL: 1.0000 "application " | TOPICAL_OIL | Status: DC | PRN

## 2017-09-30 MED ORDER — ONDANSETRON HCL 4 MG/2ML IJ SOLN: 4.0000 mg | INTRAMUSCULAR | Status: DC | PRN

## 2017-09-30 MED ORDER — SIMETHICONE 80 MG PO CHEW: 80.0000 mg | CHEWABLE_TABLET | ORAL | Status: DC | PRN

## 2017-09-30 MED ORDER — SODIUM CHLORIDE 0.9 % IV SOLN
3.0000 g | Freq: Four times a day (QID) | INTRAVENOUS | Status: AC
  Administered 2017-09-30 – 2017-10-01 (×4): 3 g via INTRAVENOUS
  Filled 2017-09-30 (×4): qty 3

## 2017-09-30 MED ORDER — DIPHENHYDRAMINE HCL 25 MG PO CAPS: 25.0000 mg | ORAL_CAPSULE | Freq: Four times a day (QID) | ORAL | Status: DC | PRN

## 2017-09-30 MED ORDER — DIBUCAINE 1 % RE OINT: 1.0000 "application " | TOPICAL_OINTMENT | RECTAL | Status: DC | PRN

## 2017-09-30 MED ORDER — WITCH HAZEL-GLYCERIN EX PADS: 1.0000 "application " | MEDICATED_PAD | CUTANEOUS | Status: DC | PRN

## 2017-09-30 MED ORDER — SENNOSIDES-DOCUSATE SODIUM 8.6-50 MG PO TABS
2.0000 | ORAL_TABLET | ORAL | Status: DC
  Administered 2017-09-30 – 2017-10-01 (×2): 2 via ORAL
  Filled 2017-09-30 (×2): qty 2

## 2017-09-30 MED ORDER — ACETAMINOPHEN 325 MG PO TABS
650.0000 mg | ORAL_TABLET | ORAL | Status: DC | PRN
  Administered 2017-09-30: 650 mg via ORAL
  Filled 2017-09-30: qty 2

## 2017-09-30 MED ORDER — NIFEDIPINE ER OSMOTIC RELEASE 30 MG PO TB24
30.0000 mg | ORAL_TABLET | Freq: Every day | ORAL | Status: DC
  Administered 2017-09-30 – 2017-10-02 (×3): 30 mg via ORAL
  Filled 2017-09-30 (×3): qty 1

## 2017-09-30 MED ORDER — LIDOCAINE HCL (PF) 1 % IJ SOLN
INTRAMUSCULAR | Status: DC | PRN
  Administered 2017-09-30: 13 mL via EPIDURAL

## 2017-09-30 MED FILL — Penicillin G Potassium For Inj 5000000 Unit: INTRAMUSCULAR | Qty: 3 | Status: AC

## 2017-09-30 MED FILL — Sodium Chloride IV Soln 0.9%: INTRAVENOUS | Qty: 100 | Status: AC

## 2017-09-30 NOTE — Progress Notes (Signed)
Spoke to The PNC FinancialLori Clemmons NP, reviewed Glucose 143, intake, recent VS. No new orders are received.

## 2017-09-30 NOTE — Progress Notes (Signed)
Pt shivering uncontrollably possibly r/t PO cytotec post delivery --BPs are inaccurate due to shivering-will continue to assess

## 2017-09-30 NOTE — Progress Notes (Signed)
10 instruments 2 injectables 5 big sponges 5 small sponges 0830 09/30/2017  MO

## 2017-09-30 NOTE — Anesthesia Procedure Notes (Signed)
Epidural Patient location during procedure: OB Start time: 09/29/2017 11:54 PM End time: 09/30/2017 12:05 AM  Staffing Anesthesiologist: Lowella CurbMiller, Warren Ray, MD Performed: anesthesiologist   Preanesthetic Checklist Completed: patient identified, site marked, surgical consent, pre-op evaluation, timeout performed, IV checked, risks and benefits discussed and monitors and equipment checked  Epidural Patient position: sitting Prep: ChloraPrep Patient monitoring: heart rate, cardiac monitor, continuous pulse ox and blood pressure Approach: midline Location: L2-L3 Injection technique: LOR saline  Needle:  Needle type: Tuohy  Needle gauge: 17 G Needle length: 9 cm Needle insertion depth: 6 cm Catheter type: closed end flexible Catheter size: 20 Guage Catheter at skin depth: 10 cm Test dose: negative  Assessment Events: blood not aspirated, injection not painful, no injection resistance, negative IV test and no paresthesia  Additional Notes Reason for block:procedure for pain

## 2017-09-30 NOTE — Progress Notes (Signed)
Subjective: Pt comfortable with epidural.  Called by RN to evaluate strip  Objective: BP 114/67   Pulse 84   Temp 98 F (36.7 C) (Oral)   Resp 16   Ht 5\' 6"  (1.676 m)   Wt 95.4 kg   LMP 01/08/2017   SpO2 99%   BMI 33.96 kg/m  No intake/output data recorded. No intake/output data recorded.  FHT: Category 1 FHT 135 accels, occ early or variable UC:   regular, every 2-3 minutes SVE:   Dilation: 4 Effacement (%): 80 Station: -1 Exam by:: Harriett SineNancy Su Duma CNM  Pitocin at 8 mu MVUs 180  Assessment:  G1P0 at 39 weeks IUP induction for GDMA2 Cat 1   Plan: Monitor strip and progress  Kenney HousemanNancy Jean Jorje Vanatta CNM, MSN 09/30/2017, 5:03 AM

## 2017-09-30 NOTE — Anesthesia Postprocedure Evaluation (Signed)
Anesthesia Post Note  Patient: Kristina PlummerKyeria Heydt  Procedure(s) Performed: AN AD HOC LABOR EPIDURAL     Patient location during evaluation: Mother Baby Anesthesia Type: Epidural Level of consciousness: awake and alert and oriented Pain management: satisfactory to patient Vital Signs Assessment: post-procedure vital signs reviewed and stable Respiratory status: spontaneous breathing and nonlabored ventilation Cardiovascular status: stable Postop Assessment: no headache, no backache, no signs of nausea or vomiting, adequate PO intake, patient able to bend at knees and able to ambulate (patient up walking) Anesthetic complications: no    Last Vitals:  Vitals:   09/30/17 1513 09/30/17 1559  BP: 136/71   Pulse: (!) 107   Resp:    Temp: 37.8 C 37 C  SpO2:      Last Pain:  Vitals:   09/30/17 1559  TempSrc: Oral  PainSc:    Pain Goal:                 Madison HickmanGREGORY,Heike Pounds

## 2017-09-30 NOTE — Progress Notes (Signed)
Voicemail message for Kathalene FramesEllis Greer NP and Illene BolusLori Clemmons NP regarding PP glucose result of 143. On arrival to M/B mother was nauseated and had popsicles. She ordered soup and did not inform me when she started the soup. We had talked about 2 hr PP glucose. Ms  Alto DenverHunt  forgot and had [place]izza. The glucose result 143 was 2 hrs /p eating the pizza.

## 2017-09-30 NOTE — Lactation Note (Signed)
This note was copied from a baby's chart. Lactation Consultation Note Baby 11 hrs old. Mom stated BF going well. Mom denies painful latches. Mom has large heavy breast w/semi Lt. flat nipples. Rt.nipple short shaft. Encouraged stimulation to evert nipple prior to latching.  LC unable to compress Lt. Nipple enough for baby to maintain latch. Rt. Nipple more compressible. Hand expression taught, mom demonstrated back. Collected 3 ml in spoon, fed to baby.  In football position attempted to latch baby to Lt. Breast. Baby unable to latch. Fitted mom w/#20 NS. Latched, baby suckled at intervals. Baby squills out at times. Attempted to burp, none noted. Noted after some short feeding when baby popped off nipple appeared to shrunk in NS. Fitted mom w/#16 NS. Taught application both times. Mom holding baby, LC wanted mom to apply. Baby feeding and will next feeding. Newborn behavior, STS, I&O, breast massage, spoon feeding, supply and demand discussed. Mom encouraged to feed baby 8-12 times/24 hours and with feeding cues. Mom encouraged to call for assistance or questions. WH/LC brochure given w/resources, support groups and LC services.  Patient Name: Kristina Ortiz ZOXWR'UToday's Date: 09/30/2017 Reason for consult: Initial assessment;1st time breastfeeding   Maternal Data Has patient been taught Hand Expression?: Yes Does the patient have breastfeeding experience prior to this delivery?: No  Feeding Feeding Type: Breast Milk Length of feed: 7 min(still on breast)  LATCH Score Latch: Repeated attempts needed to sustain latch, nipple held in mouth throughout feeding, stimulation needed to elicit sucking reflex.  Audible Swallowing: None  Type of Nipple: Everted at rest and after stimulation(short shaft)  Comfort (Breast/Nipple): Soft / non-tender  Hold (Positioning): Assistance needed to correctly position infant at breast and maintain latch.  LATCH Score: 6  Interventions Interventions:  Breast feeding basics reviewed;Support pillows;Assisted with latch;Position options;Skin to skin;Expressed milk;Breast massage;Hand express;Pre-pump if needed;Hand pump;Breast compression;Adjust position  Lactation Tools Discussed/Used Tools: Shells;Pump;Nipple Kristina LasterShields Shell Type: Inverted Breast pump type: Manual WIC Program: Yes Pump Review: Setup, frequency, and cleaning;Milk Storage Initiated by:: Peri JeffersonL. Euel Castile RN IBCLC Date initiated:: 09/30/17   Consult Status Consult Status: Follow-up Date: 10/01/17 Follow-up type: In-patient    Kristina DancerCARVER, Kristina Ortiz 09/30/2017, 10:08 PM

## 2017-09-30 NOTE — Progress Notes (Signed)
NP Kathalene FramesEllis Greer via RN Hubert AzureM Wilkins of pt's recheck BP 149/64, HR 109, temp 101.1 and request to review pt labwork. NP Waynette ButteryGreer will call after the delivery she is currently doing. I plan to give pt tylenol now.

## 2017-10-01 LAB — COMPREHENSIVE METABOLIC PANEL
ALBUMIN: 2.4 g/dL — AB (ref 3.5–5.0)
ALK PHOS: 101 U/L (ref 38–126)
ALT: 16 U/L (ref 0–44)
ANION GAP: 9 (ref 5–15)
AST: 22 U/L (ref 15–41)
BILIRUBIN TOTAL: 0.4 mg/dL (ref 0.3–1.2)
BUN: 7 mg/dL (ref 6–20)
CALCIUM: 8.3 mg/dL — AB (ref 8.9–10.3)
CO2: 19 mmol/L — AB (ref 22–32)
Chloride: 107 mmol/L (ref 98–111)
Creatinine, Ser: 0.59 mg/dL (ref 0.44–1.00)
GFR calc Af Amer: >60 mL/min (ref >60)
GFR calc non Af Amer: >60 mL/min (ref >60)
Glucose, Bld: 114 mg/dL — ABNORMAL HIGH (ref 70–99)
Potassium: 3.5 mmol/L (ref 3.5–5.1)
SODIUM: 135 mmol/L (ref 135–145)
TOTAL PROTEIN: 5.8 g/dL — AB (ref 6.5–8.1)

## 2017-10-01 LAB — URIC ACID: Uric Acid, Serum: 4.4 mg/dL (ref 2.5–7.1)

## 2017-10-01 LAB — CBC WITH DIFFERENTIAL/PLATELET
BASOS ABS: 0 10*3/uL (ref 0.0–0.1)
BASOS PCT: 0 %
EOS ABS: 0.2 10*3/uL (ref 0.0–0.7)
Eosinophils Relative: 1 %
HEMATOCRIT: 27.7 % — AB (ref 36.0–46.0)
HEMOGLOBIN: 9.7 g/dL — AB (ref 12.0–15.0)
Lymphocytes Relative: 26 %
Lymphs Abs: 3.3 10*3/uL (ref 0.7–4.0)
MCH: 28.4 pg (ref 26.0–34.0)
MCHC: 35 g/dL (ref 30.0–36.0)
MCV: 81.2 fL (ref 78.0–100.0)
MONOS PCT: 4 %
Monocytes Absolute: 0.5 10*3/uL (ref 0.1–1.0)
NEUTROS ABS: 8.6 10*3/uL — AB (ref 1.7–7.7)
NEUTROS PCT: 69 %
Platelets: 180 10*3/uL (ref 150–400)
RBC: 3.41 MIL/uL — AB (ref 3.87–5.11)
RDW: 16.5 % — ABNORMAL HIGH (ref 11.5–15.5)
WBC: 12.6 10*3/uL — ABNORMAL HIGH (ref 4.0–10.5)

## 2017-10-01 LAB — GLUCOSE, CAPILLARY
GLUCOSE-CAPILLARY: 108 mg/dL — AB (ref 70–99)
GLUCOSE-CAPILLARY: 84 mg/dL (ref 70–99)
Glucose-Capillary: 121 mg/dL — ABNORMAL HIGH (ref 70–99)

## 2017-10-01 LAB — LACTATE DEHYDROGENASE: LDH: 221 U/L — ABNORMAL HIGH (ref 98–192)

## 2017-10-01 NOTE — Progress Notes (Signed)
Post Partum Day 1 Subjective: no complaints, up ad lib, voiding and tolerating PO  Objective: Vitals:   09/30/17 1820 09/30/17 2220 10/01/17 0333 10/01/17 0542  BP: 130/60 (!) 143/79  131/87  Pulse: 98 76  70  Resp: 18 18  16   Temp: 98 F (36.7 C)  98 F (36.7 C) 98 F (36.7 C)  TempSrc: Oral  Oral Oral  SpO2:    100%  Weight:      Height:       Results for orders placed or performed during the hospital encounter of 09/29/17 (from the past 24 hour(s))  Protein / creatinine ratio, urine     Status: Abnormal   Collection Time: 09/30/17  8:18 AM  Result Value Ref Range   Creatinine, Urine 111.00 mg/dL   Total Protein, Urine 30 mg/dL   Protein Creatinine Ratio 0.27 (H) 0.00 - 0.15 mg/mg[Cre]  CBC with Differential/Platelet     Status: Abnormal   Collection Time: 09/30/17  8:20 AM  Result Value Ref Range   WBC 8.2 4.0 - 10.5 K/uL   RBC 4.35 3.87 - 5.11 MIL/uL   Hemoglobin 12.0 12.0 - 15.0 g/dL   HCT 16.135.7 (L) 09.636.0 - 04.546.0 %   MCV 82.1 78.0 - 100.0 fL   MCH 27.6 26.0 - 34.0 pg   MCHC 33.6 30.0 - 36.0 g/dL   RDW 40.916.6 (H) 81.111.5 - 91.415.5 %   Platelets 210 150 - 400 K/uL   Neutrophils Relative % 72 %   Neutro Abs 5.9 1.7 - 7.7 K/uL   Lymphocytes Relative 18 %   Lymphs Abs 1.4 0.7 - 4.0 K/uL   Monocytes Relative 9 %   Monocytes Absolute 0.7 0.1 - 1.0 K/uL   Eosinophils Relative 1 %   Eosinophils Absolute 0.1 0.0 - 0.7 K/uL   Basophils Relative 0 %   Basophils Absolute 0.0 0.0 - 0.1 K/uL  Comprehensive metabolic panel     Status: Abnormal   Collection Time: 09/30/17  8:20 AM  Result Value Ref Range   Sodium 135 135 - 145 mmol/L   Potassium 4.2 3.5 - 5.1 mmol/L   Chloride 106 98 - 111 mmol/L   CO2 18 (L) 22 - 32 mmol/L   Glucose, Bld 95 70 - 99 mg/dL   BUN 6 6 - 20 mg/dL   Creatinine, Ser 7.820.74 0.44 - 1.00 mg/dL   Calcium 8.7 (L) 8.9 - 10.3 mg/dL   Total Protein 6.4 (L) 6.5 - 8.1 g/dL   Albumin 2.8 (L) 3.5 - 5.0 g/dL   AST 22 15 - 41 U/L   ALT 19 0 - 44 U/L   Alkaline  Phosphatase 137 (H) 38 - 126 U/L   Total Bilirubin 0.6 0.3 - 1.2 mg/dL   GFR calc non Af Amer >60 >60 mL/min   GFR calc Af Amer >60 >60 mL/min   Anion gap 11 5 - 15  Uric acid     Status: None   Collection Time: 09/30/17  8:20 AM  Result Value Ref Range   Uric Acid, Serum 4.6 2.5 - 7.1 mg/dL  Lactate dehydrogenase     Status: None   Collection Time: 09/30/17  8:20 AM  Result Value Ref Range   LDH 191 98 - 192 U/L  Glucose, capillary     Status: Abnormal   Collection Time: 09/30/17  7:08 PM  Result Value Ref Range   Glucose-Capillary 143 (H) 70 - 99 mg/dL  CBC with Differential/Platelet  Status: Abnormal   Collection Time: 10/01/17  6:20 AM  Result Value Ref Range   WBC 12.6 (H) 4.0 - 10.5 K/uL   RBC 3.41 (L) 3.87 - 5.11 MIL/uL   Hemoglobin 9.7 (L) 12.0 - 15.0 g/dL   HCT 16.127.7 (L) 09.636.0 - 04.546.0 %   MCV 81.2 78.0 - 100.0 fL   MCH 28.4 26.0 - 34.0 pg   MCHC 35.0 30.0 - 36.0 g/dL   RDW 40.916.5 (H) 81.111.5 - 91.415.5 %   Platelets 180 150 - 400 K/uL   Neutrophils Relative % 69 %   Neutro Abs 8.6 (H) 1.7 - 7.7 K/uL   Lymphocytes Relative 26 %   Lymphs Abs 3.3 0.7 - 4.0 K/uL   Monocytes Relative 4 %   Monocytes Absolute 0.5 0.1 - 1.0 K/uL   Eosinophils Relative 1 %   Eosinophils Absolute 0.2 0.0 - 0.7 K/uL   Basophils Relative 0 %   Basophils Absolute 0.0 0.0 - 0.1 K/uL  Comprehensive metabolic panel     Status: Abnormal   Collection Time: 10/01/17  6:20 AM  Result Value Ref Range   Sodium 135 135 - 145 mmol/L   Potassium 3.5 3.5 - 5.1 mmol/L   Chloride 107 98 - 111 mmol/L   CO2 19 (L) 22 - 32 mmol/L   Glucose, Bld 114 (H) 70 - 99 mg/dL   BUN 7 6 - 20 mg/dL   Creatinine, Ser 7.820.59 0.44 - 1.00 mg/dL   Calcium 8.3 (L) 8.9 - 10.3 mg/dL   Total Protein 5.8 (L) 6.5 - 8.1 g/dL   Albumin 2.4 (L) 3.5 - 5.0 g/dL   AST 22 15 - 41 U/L   ALT 16 0 - 44 U/L   Alkaline Phosphatase 101 38 - 126 U/L   Total Bilirubin 0.4 0.3 - 1.2 mg/dL   GFR calc non Af Amer >60 >60 mL/min   GFR calc Af Amer  >60 >60 mL/min   Anion gap 9 5 - 15  Lactate dehydrogenase     Status: Abnormal   Collection Time: 10/01/17  6:20 AM  Result Value Ref Range   LDH 221 (H) 98 - 192 U/L  Uric acid     Status: None   Collection Time: 10/01/17  6:20 AM  Result Value Ref Range   Uric Acid, Serum 4.4 2.5 - 7.1 mg/dL    Physical Exam:  General: alert and cooperative Lochia: appropriate Uterine Fundus: firm Incision: n/a DVT Evaluation: No evidence of DVT seen on physical exam. Negative Homan's sign. No cords or calf tenderness. No significant calf/ankle edema.  Recent Labs    09/30/17 0820 10/01/17 0620  HGB 12.0 9.7*  HCT 35.7* 27.7*    Assessment/Plan: Plan for discharge tomorrow  Presumptive diagnosis of pregnancy induced hypertension, labs are stable and blood pressures improving with Procardia XL 30mg  QD, will continue to monitor    LOS: 2 days   Janeece Riggersllis K Darrio Bade 10/01/2017, 7:41 AM

## 2017-10-02 LAB — GLUCOSE, CAPILLARY: GLUCOSE-CAPILLARY: 91 mg/dL (ref 70–99)

## 2017-10-02 MED ORDER — IBUPROFEN 600 MG PO TABS: 600.0000 mg | ORAL_TABLET | Freq: Four times a day (QID) | ORAL | 0 refills | Status: DC

## 2017-10-02 NOTE — Discharge Summary (Signed)
OB Discharge Summary     Patient Name: Kristina PlummerKyeria Ortiz DOB: 1990/07/27 MRN: [id number]  Date of admission: 09/29/2017 Delivering MD: Janeece RiggersGREER, ELLIS K   Date of discharge: 10/02/2017  Admitting diagnosis: INDUCTION Intrauterine pregnancy: 6926w1d     Secondary diagnosis:  Active Problems:   Pregnancy  Additional problems:      Discharge diagnosis: Term Pregnancy Delivered                                                                                                Post partum procedures:  Augmentation: Pitocin  Complications: None  Hospital course:  Induction of Labor With Vaginal Delivery   27 y.o. yo G1P1001 at 7226w1d was admitted to the hospital 09/29/2017 for induction of labor.  Indication for induction: A2 DM.  Patient had an uncomplicated labor course as follows: Membrane Rupture Time/Date: 10:22 PM ,09/29/2017   Intrapartum Procedures: Episiotomy: None [1]                                         Lacerations:  None [1]  Patient had delivery of a Viable infant.  Information for the patient's newborn:  Kristina Ortiz, Girl Kristina Ortiz [[id number]][[id number]]  Delivery Method: Vaginal, Spontaneous(Filed from Delivery Summary)   09/30/2017  Details of delivery can be found in separate delivery note.  Patient had a routine postpartum course. Patient is discharged home 10/02/17.  Physical exam  Vitals:   10/01/17 0542 10/01/17 1010 10/01/17 1415 10/02/17 0608  BP: 131/87 138/78 137/77 134/82  Pulse: 70 90 65 61  Resp: 16 18 18 18   Temp: 98 F (36.7 C) 97.8 F (36.6 C) 97.6 F (36.4 C) 97.8 F (36.6 C)  TempSrc: Oral Axillary  Oral  SpO2: 100%     Weight:      Height:       General: alert, cooperative and no distress Lochia: appropriate Uterine Fundus: firm Incision:  DVT Evaluation: No evidence of DVT seen on physical exam. Labs: Lab Results  Component Value Date   WBC 12.6 (H) 10/01/2017   HGB 9.7 (L) 10/01/2017   HCT 27.7 (L) 10/01/2017   MCV 81.2 10/01/2017   PLT 180 10/01/2017    CMP Latest Ref Rng & Units 10/01/2017  Glucose 70 - 99 mg/dL 782(N114(H)  BUN 6 - 20 mg/dL 7  Creatinine 5.620.44 - 1.301.00 mg/dL 8.650.59  Sodium 784135 - 696145 mmol/L 135  Potassium 3.5 - 5.1 mmol/L 3.5  Chloride 98 - 111 mmol/L 107  CO2 22 - 32 mmol/L 19(L)  Calcium 8.9 - 10.3 mg/dL 8.3(L)  Total Protein 6.5 - 8.1 g/dL 2.9(B5.8(L)  Total Bilirubin 0.3 - 1.2 mg/dL 0.4  Alkaline Phos 38 - 126 U/L 101  AST 15 - 41 U/L 22  ALT 0 - 44 U/L 16    Discharge instruction: per After Visit Summary and "Baby and Me Booklet".  After visit meds:  Allergies as of 10/02/2017      Reactions   Cinnamon    Facial swelling  Medication List    STOP taking these medications   glyBURIDE 5 MG tablet Commonly known as:  DIABETA     TAKE these medications   ferrous sulfate 325 (65 FE) MG EC tablet Take 325 mg by mouth daily with breakfast.   ibuprofen 600 MG tablet Commonly known as:  ADVIL,MOTRIN Take 1 tablet (600 mg total) by mouth every 6 (six) hours.   PRENATAL GUMMIES/DHA & FA PO Take 2 capsules by mouth daily.       Diet: routine diet  Activity: Advance as tolerated. Pelvic rest for 6 weeks.   Outpatient follow up:6 weeks Follow up Appt:No future appointments. Follow up Visit:No follow-ups on file.  Postpartum contraception: Undecided  Newborn Data: Live born female  Birth Weight: 7 lb 7.4 oz (3385 g) APGAR: 9, 9  Newborn Delivery   Birth date/time:  09/30/2017 11:02:00 Delivery type:  Vaginal, Spontaneous     Baby Feeding: Breast Disposition:home with mother   10/02/2017 Kristina Ortiz, CNM

## 2017-10-02 NOTE — Discharge Instructions (Signed)

## 2017-10-02 NOTE — Lactation Note (Signed)
This note was copied from a baby's chart. Lactation Consultation Note  Patient Name: Kristina Ortiz NWGNF'AToday's Date: 10/02/2017 Reason for consult: 1st time breastfeeding;Follow-up assessment;Hyperbilirubinemia P1, 42 hour female infant  LC entered room infant was  on bili lights Infant was cuing and fussing to BF, LC notice infant  w/ pacifier in  her mouth. LC discussed   w/ mom that pacifier use  :may cause parents to miss hunger cuing,  it has a non- nutritive suck and may reduce milk production/ or supply. Per mom , she has not been pumping, hand expressing nor using the nipple shield as discussed by LC previously. Per mom, infant latches w/out nipple shield  and does better with the  foot ball hold position. Mom used cross cradle position  and latched infant on left breast,  LC notice infant's had wide mouth gape but keep coming off breast during feeding. LC ask mom to  firm breast more in infant's mouth for tighter seal. Infant BF for 8 minutes. LC reviewed hand expression w/ mom, mom hand express 5 ml of colostrum which infant was then  spoon fed. Mom plans to hand express and give infant EBM after each feeding. Mom will start  pumping w/ DEBP  q3h for 15-20 min.to help with breast induction and  stimulation.  Mom shown how to use DEBP & how to disassemble, clean, & reassemble parts. LC reviewed : LC O/P services, BF support groups, community resources and our phone # for post discharge questions.     Maternal Data    Feeding Feeding Type: Breast Fed Length of feed: 8 min  LATCH Score Latch: Repeated attempts needed to sustain latch, nipple held in mouth throughout feeding, stimulation needed to elicit sucking reflex.  Audible Swallowing: A few with stimulation  Type of Nipple: Flat  Comfort (Breast/Nipple): Soft / non-tender  Hold (Positioning): No assistance needed to correctly position infant at breast.  LATCH Score: 7  Interventions    Lactation Tools  Discussed/Used     Consult Status Consult Status: Follow-up Date: 10/02/17 Follow-up type: In-patient    Kristina Ortiz 10/02/2017, 5:26 AM

## 2017-10-04 ENCOUNTER — Ambulatory Visit: Payer: Self-pay

## 2017-10-04 NOTE — Lactation Note (Signed)
This note was copied from a baby's chart. Lactation Consultation Note  Patient Name: Kristina Ortiz ZOXWR'UToday's Date: 10/04/2017  Mom states she no longer plans on breastfeeding or pumping.  Reports breasts are comfortable.   Maternal Data    Feeding Feeding Type: Bottle Fed - Formula Nipple Type: Slow - flow  LATCH Score                   Interventions    Lactation Tools Discussed/Used     Consult Status      Huston FoleyMOULDEN, Cathlin Buchan S 10/04/2017, 1:38 PM

## 2017-10-09 ENCOUNTER — Other Ambulatory Visit: Payer: Self-pay

## 2017-10-09 ENCOUNTER — Encounter (HOSPITAL_COMMUNITY): Payer: Self-pay | Admitting: *Deleted

## 2017-10-09 ENCOUNTER — Inpatient Hospital Stay (HOSPITAL_COMMUNITY)
Admission: AD | Admit: 2017-10-09 | Discharge: 2017-10-11 | DRG: 776 | Disposition: A | Discharge status: Home / Self Care | Payer: BLUE CROSS/BLUE SHIELD | Attending: Obstetrics & Gynecology | Admitting: Obstetrics & Gynecology

## 2017-10-09 DIAGNOSIS — O165 Unspecified maternal hypertension, complicating the puerperium: Secondary | ICD-10-CM | POA: Diagnosis present

## 2017-10-09 DIAGNOSIS — O135 Gestational [pregnancy-induced] hypertension without significant proteinuria, complicating the puerperium: Secondary | ICD-10-CM

## 2017-10-09 DIAGNOSIS — O1495 Unspecified pre-eclampsia, complicating the puerperium: Secondary | ICD-10-CM

## 2017-10-09 DIAGNOSIS — R030 Elevated blood-pressure reading, without diagnosis of hypertension: Secondary | ICD-10-CM | POA: Diagnosis not present

## 2017-10-09 LAB — CBC WITH DIFFERENTIAL/PLATELET
BASOS ABS: 0 10*3/uL (ref 0.0–0.1)
Basophils Relative: 0 %
Eosinophils Absolute: 0.3 10*3/uL (ref 0.0–0.7)
Eosinophils Relative: 4 %
HCT: 36 % (ref 36.0–46.0)
HEMOGLOBIN: 11.6 g/dL — AB (ref 12.0–15.0)
LYMPHS ABS: 2.7 10*3/uL (ref 0.7–4.0)
LYMPHS PCT: 39 %
MCH: 27 pg (ref 26.0–34.0)
MCHC: 32.2 g/dL (ref 30.0–36.0)
MCV: 83.7 fL (ref 78.0–100.0)
Monocytes Absolute: 0.3 10*3/uL (ref 0.1–1.0)
Monocytes Relative: 4 %
NEUTROS PCT: 53 %
Neutro Abs: 3.7 10*3/uL (ref 1.7–7.7)
Platelets: 358 10*3/uL (ref 150–400)
RBC: 4.3 MIL/uL (ref 3.87–5.11)
RDW: 16.2 % — ABNORMAL HIGH (ref 11.5–15.5)
WBC: 7 10*3/uL (ref 4.0–10.5)

## 2017-10-09 LAB — COMPREHENSIVE METABOLIC PANEL
ALK PHOS: 103 U/L (ref 38–126)
ALT: 33 U/L (ref 0–44)
AST: 29 U/L (ref 15–41)
Albumin: 3.3 g/dL — ABNORMAL LOW (ref 3.5–5.0)
Anion gap: 8 (ref 5–15)
BUN: 9 mg/dL (ref 6–20)
CALCIUM: 8.6 mg/dL — AB (ref 8.9–10.3)
CO2: 22 mmol/L (ref 22–32)
CREATININE: 0.85 mg/dL (ref 0.44–1.00)
Chloride: 108 mmol/L (ref 98–111)
GFR calc non Af Amer: >60 mL/min (ref >60)
Glucose, Bld: 78 mg/dL (ref 70–99)
Potassium: 3.5 mmol/L (ref 3.5–5.1)
Sodium: 138 mmol/L (ref 135–145)
Total Bilirubin: 0.4 mg/dL (ref 0.3–1.2)
Total Protein: 7.1 g/dL (ref 6.5–8.1)

## 2017-10-09 LAB — URIC ACID: Uric Acid, Serum: 8.9 mg/dL — ABNORMAL HIGH (ref 2.5–7.1)

## 2017-10-09 LAB — LACTATE DEHYDROGENASE: LDH: 251 U/L — AB (ref 98–192)

## 2017-10-09 LAB — PROTEIN / CREATININE RATIO, URINE: Creatinine, Urine: 130 mg/dL

## 2017-10-09 MED ORDER — MAGNESIUM SULFATE BOLUS VIA INFUSION
4.0000 g | Freq: Once | INTRAVENOUS | Status: AC
  Administered 2017-10-09: 4 g via INTRAVENOUS
  Filled 2017-10-09: qty 500

## 2017-10-09 MED ORDER — NIFEDIPINE ER 30 MG PO TB24: 30.0000 mg | ORAL_TABLET | Freq: Every day | ORAL | 3 refills | Status: DC

## 2017-10-09 MED ORDER — HYDRALAZINE HCL 20 MG/ML IJ SOLN: 10.0000 mg | INTRAMUSCULAR | Status: DC | PRN

## 2017-10-09 MED ORDER — NIFEDIPINE ER OSMOTIC RELEASE 30 MG PO TB24
30.0000 mg | ORAL_TABLET | Freq: Every day | ORAL | Status: DC
  Administered 2017-10-09 – 2017-10-10 (×2): 30 mg via ORAL
  Filled 2017-10-09 (×3): qty 1

## 2017-10-09 MED ORDER — LACTATED RINGERS IV SOLN
INTRAVENOUS | Status: DC
  Administered 2017-10-09 – 2017-10-10 (×3): via INTRAVENOUS

## 2017-10-09 MED ORDER — SODIUM CHLORIDE 0.9% FLUSH: 3.0000 mL | INTRAVENOUS | Status: DC | PRN

## 2017-10-09 MED ORDER — LABETALOL HCL 5 MG/ML IV SOLN: 40.0000 mg | INTRAVENOUS | Status: DC | PRN

## 2017-10-09 MED ORDER — LABETALOL HCL 5 MG/ML IV SOLN
80.0000 mg | INTRAVENOUS | Status: DC | PRN
  Administered 2017-10-09: 80 mg via INTRAVENOUS
  Filled 2017-10-09: qty 16

## 2017-10-09 MED ORDER — LABETALOL HCL 5 MG/ML IV SOLN: 20.0000 mg | INTRAVENOUS | Status: DC | PRN

## 2017-10-09 MED ORDER — SODIUM CHLORIDE 0.9% FLUSH: 3.0000 mL | Freq: Two times a day (BID) | INTRAVENOUS | Status: DC

## 2017-10-09 MED ORDER — LABETALOL HCL 5 MG/ML IV SOLN
40.0000 mg | INTRAVENOUS | Status: DC | PRN
  Administered 2017-10-09: 40 mg via INTRAVENOUS
  Filled 2017-10-09: qty 8

## 2017-10-09 MED ORDER — SODIUM CHLORIDE 0.9 % IV SOLN: 250.0000 mL | INTRAVENOUS | Status: DC | PRN

## 2017-10-09 MED ORDER — LABETALOL HCL 5 MG/ML IV SOLN
20.0000 mg | INTRAVENOUS | Status: DC | PRN
  Administered 2017-10-09: 20 mg via INTRAVENOUS
  Filled 2017-10-09: qty 4

## 2017-10-09 MED ORDER — LABETALOL HCL 5 MG/ML IV SOLN: 80.0000 mg | INTRAVENOUS | Status: DC | PRN

## 2017-10-09 MED ORDER — MAGNESIUM SULFATE 40 G IN LACTATED RINGERS - SIMPLE
2.0000 g/h | INTRAVENOUS | Status: AC
  Administered 2017-10-09: 2 g/h via INTRAVENOUS
  Filled 2017-10-09 (×2): qty 500

## 2017-10-09 NOTE — H&P (Addendum)
Kristina Ortiz is a 27 y.o. G1P1 who is 9 days postpartum. She was encouraged to come to the MAU by the [facility] RN who noted an elevated blood pressure at her visit this morning. The patient was diagnosed with pregnancy induced hypertension during her admission for delivery. Preeclampsia labs were negative at that time and are still negative now. Patient denies symptoms of preeclampsia at this time.   OB History    Gravida  1   Para  1   Term  1   Preterm      AB      Living  1     SAB      TAB      Ectopic      Multiple  0   Live Births  1          Past Medical History:  Diagnosis Date  . Late prenatal care   . Medical history non-contributory   . Vaginal Pap smear, abnormal    Past Surgical History:  Procedure Laterality Date  . TONSILLECTOMY    . WISDOM TOOTH EXTRACTION      Family History:   family history includes COPD in her paternal aunt; Hypertension in her father and mother.   Social History:   reports that she has never smoked. She has never used smokeless tobacco. She reports that she drank alcohol. She reports that she does not use drugs.   Vitals:   10/09/17 1946 10/09/17 2001 10/09/17 2019 10/09/17 2020  BP: (!) 157/83 (!) 166/90  (!) 173/100  Pulse: (!) 57 63  69  Resp:   18   Temp:      TempSrc:      SpO2:      Weight:       Review of Systems  Eyes: Negative for blurred vision.  Gastrointestinal: Negative for abdominal pain.  Neurological: Negative for headaches.  All other systems reviewed and are negative.   Physical Exam  Constitutional: She is oriented to person, place, and time and well-developed, well-nourished, and in no distress.  HENT:  Head: Normocephalic.  Eyes: Pupils are equal, round, and reactive to light.  Cardiovascular: Normal rate, regular rhythm and normal heart sounds.  Pulmonary/Chest: Effort normal and breath sounds normal.  Abdominal: Soft. Bowel sounds are normal.  Musculoskeletal: Normal range of  motion.  Neurological: She is alert and oriented to person, place, and time.  Skin: Skin is warm and dry.  Psychiatric: Mood, memory, affect and judgment normal.  Nursing note and vitals reviewed.  Results for orders placed or performed during the hospital encounter of 10/09/17 (from the past 24 hour(s))  Protein / creatinine ratio, urine     Status: None   Collection Time: 10/09/17  7:07 PM  Result Value Ref Range   Creatinine, Urine 130.00 mg/dL   Total Protein, Urine <6 mg/dL   Protein Creatinine Ratio        0.00 - 0.15 mg/mg[Cre]  CBC with Differential/Platelet     Status: Abnormal   Collection Time: 10/09/17  7:10 PM  Result Value Ref Range   WBC 7.0 4.0 - 10.5 K/uL   RBC 4.30 3.87 - 5.11 MIL/uL   Hemoglobin 11.6 (L) 12.0 - 15.0 g/dL   HCT 42.5 95.6 - 38.7 %   MCV 83.7 78.0 - 100.0 fL   MCH 27.0 26.0 - 34.0 pg   MCHC 32.2 30.0 - 36.0 g/dL   RDW 56.4 (H) 33.2 - 95.1 %   Platelets 358  150 - 400 K/uL   Neutrophils Relative % 53 %   Neutro Abs 3.7 1.7 - 7.7 K/uL   Lymphocytes Relative 39 %   Lymphs Abs 2.7 0.7 - 4.0 K/uL   Monocytes Relative 4 %   Monocytes Absolute 0.3 0.1 - 1.0 K/uL   Eosinophils Relative 4 %   Eosinophils Absolute 0.3 0.0 - 0.7 K/uL   Basophils Relative 0 %   Basophils Absolute 0.0 0.0 - 0.1 K/uL  Comprehensive metabolic panel     Status: Abnormal   Collection Time: 10/09/17  7:10 PM  Result Value Ref Range   Sodium 138 135 - 145 mmol/L   Potassium 3.5 3.5 - 5.1 mmol/L   Chloride 108 98 - 111 mmol/L   CO2 22 22 - 32 mmol/L   Glucose, Bld 78 70 - 99 mg/dL   BUN 9 6 - 20 mg/dL   Creatinine, Ser 1.610.85 0.44 - 1.00 mg/dL   Calcium 8.6 (L) 8.9 - 10.3 mg/dL   Total Protein 7.1 6.5 - 8.1 g/dL   Albumin 3.3 (L) 3.5 - 5.0 g/dL   AST 29 15 - 41 U/L   ALT 33 0 - 44 U/L   Alkaline Phosphatase 103 38 - 126 U/L   Total Bilirubin 0.4 0.3 - 1.2 mg/dL   GFR calc non Af Amer >60 >60 mL/min   GFR calc Af Amer >60 >60 mL/min   Anion gap 8 5 - 15  Uric acid      Status: Abnormal   Collection Time: 10/09/17  7:10 PM  Result Value Ref Range   Uric Acid, Serum 8.9 (H) 2.5 - 7.1 mg/dL  Lactate dehydrogenase     Status: Abnormal   Collection Time: 10/09/17  7:10 PM  Result Value Ref Range   LDH 251 (H) 98 - 192 U/L    Assessment/Plan: 27 y.o. G1P1 at 9 days postpartum Pregnancy induced hypertension Consulted Dr. Charlotta Newtonzan Admit patient for observation to antepartum floor  Procardia 30mg  XL QD ordered and given in MAU Labetalol protocol initiated Will start patient on postpartum Magnesium Sulfate for 24 hours if severe range blood pressures do not resolve after 2 doses of IV labetalol or patient develops preeclampsia symptoms   Janeece Riggersllis K Missi Mcmackin 10/09/17 8:56 PM   Patient has now required 3 doses of Labetalol IV per protocol. Per Dr. Charlotta Newtonzan, patient's meets criteria for diagnosis of postpartum preeclampsia. Patient is going to be started on Magnesium Sulfate for 24 hours.   Janeece Riggersllis K Shakeya Kerkman 10/09/17 9:48 PM

## 2017-10-09 NOTE — MAU Note (Signed)
Had home visit from the nurse and her bp was high. No HA, visual changes or epigastric pain.  Feet has been swollen. Vag del 8/13.  Baby is good.

## 2017-10-09 NOTE — MAU Provider Note (Addendum)
Chief Complaint: Hypertension   First Provider Initiated Contact with Patient 10/09/17 1836     SUBJECTIVE HPI: Kristina Ortiz is a 27 y.o. G1P1001one 3 weeks postpartum from SVD who presents to Maternity Admissions reporting Had home visit from the nurse and her bp was high. No HA, visual changes or epigastric pain.  Feet has been swollen.  Infant good.  Breastfeeding.  States bleeding minimal, no pain, had no sutures.  Pregnancy hx of GDMA2 and pregnancy induced hypertension (preeclampsia labs were negative during patient's admission for delivery).   Past Medical History:  Diagnosis Date  . Late prenatal care   . Medical history non-contributory   . Vaginal Pap smear, abnormal    OB History  Gravida Para Term Preterm AB Living  1 1 1     1   SAB TAB Ectopic Multiple Live Births        0 1    # Outcome Date GA Lbr Len/2nd Weight Sex Delivery Anes PTL Lv  1 Term 09/30/17 [redacted]w[redacted]d 12:02 / 00:38 3385 g F Vag-Spont EPI  LIV   Past Surgical History:  Procedure Laterality Date  . TONSILLECTOMY    . WISDOM TOOTH EXTRACTION     Social History   Socioeconomic History  . Marital status: Single    Spouse name: Not on file  . Number of children: Not on file  . Years of education: Not on file  . Highest education level: Not on file  Occupational History  . Not on file  Social Needs  . Financial resource strain: Not hard at all  . Food insecurity:    Worry: Never true    Inability: Never true  . Transportation needs:    Medical: No    Non-medical: No  Tobacco Use  . Smoking status: Never Smoker  . Smokeless tobacco: Never Used  Substance and Sexual Activity  . Alcohol use: Not Currently    Comment: socially  . Drug use: No  . Sexual activity: Yes  Lifestyle  . Physical activity:    Days per week: 7 days    Minutes per session: 20 min  . Stress: Only a little  Relationships  . Social connections:    Talks on phone: More than three times a week    Gets together: More than three  times a week    Attends religious service: Never    Active member of club or organization: No    Attends meetings of clubs or organizations: Never    Relationship status: Living with partner  . Intimate partner violence:    Fear of current or ex partner: No    Emotionally abused: No    Physically abused: No    Forced sexual activity: No  Other Topics Concern  . Not on file  Social History Narrative  . Not on file   Family History  Problem Relation Age of Onset  . Hypertension Mother   . Hypertension Father   . COPD Paternal Aunt    No current facility-administered medications on file prior to encounter.    Current Outpatient Medications on File Prior to Encounter  Medication Sig Dispense Refill  . ferrous sulfate 325 (65 FE) MG EC tablet Take 325 mg by mouth daily with breakfast.     . ibuprofen (ADVIL,MOTRIN) 600 MG tablet Take 1 tablet (600 mg total) by mouth every 6 (six) hours. 30 tablet 0  . Prenatal MV-Min-FA-Omega-3 (PRENATAL GUMMIES/DHA & FA PO) Take 2 capsules by mouth daily.  Allergies  Allergen Reactions  . Cinnamon     Facial swelling    I have reviewed patient's Past Medical Hx, Surgical Hx, Family Hx, Social Hx, medications and allergies.   Review of Systems  Constitutional: Negative.   HENT: Negative.   Eyes: Negative.   Respiratory: Negative.   Cardiovascular: Negative.   Gastrointestinal: Negative.   Endocrine: Negative.   Genitourinary: Negative.   Musculoskeletal: Negative.   Skin: Negative.   Allergic/Immunologic: Negative.   Neurological: Negative.   Hematological: Negative.   Psychiatric/Behavioral: Negative.     OBJECTIVE Patient Vitals for the past 24 hrs:  BP Temp Temp src Pulse Resp SpO2 Weight  10/09/17 2020 (!) 173/100 - - 69 - - -  10/09/17 2019 - - - - 18 - -  10/09/17 2001 (!) 166/90 - - 63 - - -  10/09/17 1946 (!) 157/83 - - (!) 57 - - -  10/09/17 1931 (!) 148/78 - - 68 - - -  10/09/17 1916 (!) 147/75 - - 72 - - -   10/09/17 1914 (!) 152/78 - - 2667 - - -  10/09/17 1833 (!) 154/94 98.3 F (36.8 C) Oral 71 18 100 % 90.5 kg   Constitutional: Well-developed, well-nourished female in no acute distress.  Cardiovascular: normal rate Respiratory: normal rate and effort.  GI: Abd soft, non-tender, gravid appropriate for gestational age. Pos BS x 4 MS: Extremities nontender, no edema, normal ROM Neurologic: Alert and oriented x 4.  ZO:[id number]:deferred  LAB RESULTS Results for orders placed or performed during the hospital encounter of 10/09/17 (from the past 24 hour(s))  Protein / creatinine ratio, urine     Status: None   Collection Time: 10/09/17  7:07 PM  Result Value Ref Range   Creatinine, Urine 130.00 mg/dL   Total Protein, Urine <6 mg/dL   Protein Creatinine Ratio        0.00 - 0.15 mg/mg[Cre]  CBC with Differential/Platelet     Status: Abnormal   Collection Time: 10/09/17  7:10 PM  Result Value Ref Range   WBC 7.0 4.0 - 10.5 K/uL   RBC 4.30 3.87 - 5.11 MIL/uL   Hemoglobin 11.6 (L) 12.0 - 15.0 g/dL   HCT 09.836.0 11.936.0 - 14.746.0 %   MCV 83.7 78.0 - 100.0 fL   MCH 27.0 26.0 - 34.0 pg   MCHC 32.2 30.0 - 36.0 g/dL   RDW 82.916.2 (H) 56.211.5 - 13.015.5 %   Platelets 358 150 - 400 K/uL   Neutrophils Relative % 53 %   Neutro Abs 3.7 1.7 - 7.7 K/uL   Lymphocytes Relative 39 %   Lymphs Abs 2.7 0.7 - 4.0 K/uL   Monocytes Relative 4 %   Monocytes Absolute 0.3 0.1 - 1.0 K/uL   Eosinophils Relative 4 %   Eosinophils Absolute 0.3 0.0 - 0.7 K/uL   Basophils Relative 0 %   Basophils Absolute 0.0 0.0 - 0.1 K/uL  Comprehensive metabolic panel     Status: Abnormal   Collection Time: 10/09/17  7:10 PM  Result Value Ref Range   Sodium 138 135 - 145 mmol/L   Potassium 3.5 3.5 - 5.1 mmol/L   Chloride 108 98 - 111 mmol/L   CO2 22 22 - 32 mmol/L   Glucose, Bld 78 70 - 99 mg/dL   BUN 9 6 - 20 mg/dL   Creatinine, Ser 8.650.85 0.44 - 1.00 mg/dL   Calcium 8.6 (L) 8.9 - 10.3 mg/dL   Total Protein 7.1 6.5 - 8.1  g/dL   Albumin 3.3 (L)  3.5 - 5.0 g/dL   AST 29 15 - 41 U/L   ALT 33 0 - 44 U/L   Alkaline Phosphatase 103 38 - 126 U/L   Total Bilirubin 0.4 0.3 - 1.2 mg/dL   GFR calc non Af Amer >60 >60 mL/min   GFR calc Af Amer >60 >60 mL/min   Anion gap 8 5 - 15  Uric acid     Status: Abnormal   Collection Time: 10/09/17  7:10 PM  Result Value Ref Range   Uric Acid, Serum 8.9 (H) 2.5 - 7.1 mg/dL  Lactate dehydrogenase     Status: Abnormal   Collection Time: 10/09/17  7:10 PM  Result Value Ref Range   LDH 251 (H) 98 - 192 U/L    IMAGING No results found.  MAU COURSE Orders Placed This Encounter  Procedures  . CBC with Differential/Platelet  . Comprehensive metabolic panel  . Protein / creatinine ratio, urine  . Uric acid  . Lactate dehydrogenase  . Diet regular Room service appropriate? Yes; Fluid consistency: Thin  . Vital signs  . Discharge diagnosis  . Measure blood pressure  . Notify Physician  . Initiate Oral Care Protocol  . Initiate Carrier Fluid Protocol  . SCDs  . Vital signs  . Measure blood pressure  . Care order/instruction: If patient requires greater than 2 doses of IV labetalol, inform provider so Magnesium may be ordered.  . Full code  . Place in observation (patient's expected length of stay will be less than 2 midnights)   Meds ordered this encounter  Medications  . NIFEdipine (PROCARDIA-XL/ADALAT-CC/NIFEDICAL-XL) 24 hr tablet 30 mg  . NIFEdipine (PROCARDIA-XL/ADALAT CC) 30 MG 24 hr tablet    Sig: Take 1 tablet (30 mg total) by mouth daily.    Dispense:  30 tablet    Refill:  3    Order Specific Question:   Supervising Provider    Answer:   Myna Hidalgo [[id number]]  . DISCONTD: labetalol (NORMODYNE,TRANDATE) injection 20 mg  . DISCONTD: labetalol (NORMODYNE,TRANDATE) injection 40 mg  . DISCONTD: labetalol (NORMODYNE,TRANDATE) injection 80 mg  . DISCONTD: hydrALAZINE (APRESOLINE) injection 10 mg  . sodium chloride flush (NS) 0.9 % injection 3 mL  . sodium chloride flush (NS) 0.9  % injection 3 mL  . 0.9 %  sodium chloride infusion  . AND Linked Order Group   . labetalol (NORMODYNE,TRANDATE) injection 20 mg   . labetalol (NORMODYNE,TRANDATE) injection 40 mg   . labetalol (NORMODYNE,TRANDATE) injection 80 mg   . hydrALAZINE (APRESOLINE) injection 10 mg    MDM PE, labs, cath UA  ASSESSMENT Pregnancy induced hypertension  PLAN 27 y.o. G1P1 3 weeks postpartum Pregnancy induced hypertension Reviewed lab results and plan of care with Dr. Charlotta Newton Start patient on Procardia 30XL QD Patient to have follow-up visit for blood pressure check on Monday, 10/13/17 Preeclampsia precautions discussed and handout provided Discharge home in stable condition Follow-up Information    [facility] Follow up in 4 day(s).   Specialty:  Obstetrics and Gynecology Why:  Follow up in the office for a blood pressure check on Monday, 10/13/17 Contact information: [address] [place] [address] [phone]          Janeece Riggers, CNM 10/09/2017  8:46 PM  RN took patient's blood pressure prior to discharging this patient and noted a severe range blood pressure. A second severe range reading was then noted. IV Labetalol  per protocol was then ordered. Consulted Dr. Charlotta Newton, will now admit patient for observation. If patient requires more than 2 doses of IV labetalol or develops preeclampsia symptoms, will start patient on 24 hours of postpartum Magnesium Sulfate.   Janeece Riggers 10/09/17 8:47 PM

## 2017-10-09 NOTE — Discharge Instructions (Signed)
Preeclampsia and Eclampsia °Preeclampsia is a serious condition that develops only during pregnancy. It is also called toxemia of pregnancy. This condition causes high blood pressure along with other symptoms, such as swelling and headaches. These symptoms may develop as the condition gets worse. Preeclampsia may occur at 20 weeks of pregnancy or later. °Diagnosing and treating preeclampsia early is very important. If not treated early, it can cause serious problems for you and your baby. One problem it can lead to is eclampsia, which is a condition that causes muscle jerking or shaking (convulsions or seizures) in the mother. Delivering your baby is the best treatment for preeclampsia or eclampsia. Preeclampsia and eclampsia symptoms usually go away after your baby is born. °What are the causes? °The cause of preeclampsia is not known. °What increases the risk? °The following risk factors make you more likely to develop preeclampsia: °· Being pregnant for the first time. °· Having had preeclampsia during a past pregnancy. °· Having a family history of preeclampsia. °· Having high blood pressure. °· Being pregnant with twins or triplets. °· Being 35 or older. °· Being African-American. °· Having kidney disease or diabetes. °· Having medical conditions such as lupus or blood diseases. °· Being very overweight (obese). ° °What are the signs or symptoms? °The earliest signs of preeclampsia are: °· High blood pressure. °· Increased protein in your urine. Your health care provider will check for this at every visit before you give birth (prenatal visit). ° °Other symptoms that may develop as the condition gets worse include: °· Severe headaches. °· Sudden weight gain. °· Swelling of the hands, face, legs, and feet. °· Nausea and vomiting. °· Vision problems, such as blurred or double vision. °· Numbness in the face, arms, legs, and feet. °· Urinating less than usual. °· Dizziness. °· Slurred speech. °· Abdominal pain,  especially upper abdominal pain. °· Convulsions or seizures. ° °Symptoms generally go away after giving birth. °How is this diagnosed? °There are no screening tests for preeclampsia. Your health care provider will ask you about symptoms and check for signs of preeclampsia during your prenatal visits. You may also have tests that include: °· Urine tests. °· Blood tests. °· Checking your blood pressure. °· Monitoring your baby’s heart rate. °· Ultrasound. ° °How is this treated? °You and your health care provider will determine the treatment approach that is best for you. Treatment may include: °· Having more frequent prenatal exams to check for signs of preeclampsia, if you have an increased risk for preeclampsia. °· Bed rest. °· Reducing how much salt (sodium) you eat. °· Medicine to lower your blood pressure. °· Staying in the hospital, if your condition is severe. There, treatment will focus on controlling your blood pressure and the amount of fluids in your body (fluid retention). °· You may need to take medicine (magnesium sulfate) to prevent seizures. This medicine may be given as an injection or through an IV tube. °· Delivering your baby early, if your condition gets worse. You may have your labor started with medicine (induced), or you may have a cesarean delivery. ° °Follow these instructions at home: °Eating and drinking ° °· Drink enough fluid to keep your urine clear or pale yellow. °· Eat a healthy diet that is low in sodium. Do not add salt to your food. Check nutrition labels to see how much sodium a food or beverage contains. °· Avoid caffeine. °Lifestyle °· Do not use any products that contain nicotine or tobacco, such as cigarettes   and e-cigarettes. If you need help quitting, ask your health care provider. °· Do not use alcohol or drugs. °· Avoid stress as much as possible. Rest and get plenty of sleep. °General instructions °· Take over-the-counter and prescription medicines only as told by your  health care provider. °· When lying down, lie on your side. This keeps pressure off of your baby. °· When sitting or lying down, raise (elevate) your feet. Try putting some pillows underneath your lower legs. °· Exercise regularly. Ask your health care provider what kinds of exercise are best for you. °· Keep all follow-up and prenatal visits as told by your health care provider. This is important. °How is this prevented? °To prevent preeclampsia or eclampsia from developing during another pregnancy: °· Get proper medical care during pregnancy. Your health care provider may be able to prevent preeclampsia or diagnose and treat it early. °· Your health care provider may have you take a low-dose aspirin or a calcium supplement during your next pregnancy. °· You may have tests of your blood pressure and kidney function after giving birth. °· Maintain a healthy weight. Ask your health care provider for help managing weight gain during pregnancy. °· Work with your health care provider to manage any long-term (chronic) health conditions you have, such as diabetes or kidney problems. ° °Contact a health care provider if: °· You gain more weight than expected. °· You have headaches. °· You have nausea or vomiting. °· You have abdominal pain. °· You feel dizzy or light-headed. °Get help right away if: °· You develop sudden or severe swelling anywhere in your body. This usually happens in the legs. °· You gain 5 lbs (2.3 kg) or more during one week. °· You have severe: °? Abdominal pain. °? Headaches. °? Dizziness. °? Vision problems. °? Confusion. °? Nausea or vomiting. °· You have a seizure. °· You have trouble moving any part of your body. °· You develop numbness in any part of your body. °· You have trouble speaking. °· You have any abnormal bleeding. °· You pass out. °This information is not intended to replace advice given to you by your health care provider. Make sure you discuss any questions you have with your health  care provider. °Document Released: 02/02/2000 Document Revised: 10/03/2015 Document Reviewed: 09/11/2015 °Elsevier Interactive Patient Education © 2018 [employer] ° °

## 2017-10-10 LAB — COMPREHENSIVE METABOLIC PANEL
ALBUMIN: 3.2 g/dL — AB (ref 3.5–5.0)
ALT: 31 U/L (ref 0–44)
AST: 26 U/L (ref 15–41)
Alkaline Phosphatase: 96 U/L (ref 38–126)
Anion gap: 11 (ref 5–15)
BILIRUBIN TOTAL: 0.4 mg/dL (ref 0.3–1.2)
BUN: 9 mg/dL (ref 6–20)
CO2: 21 mmol/L — ABNORMAL LOW (ref 22–32)
CREATININE: 0.79 mg/dL (ref 0.44–1.00)
Calcium: 8.1 mg/dL — ABNORMAL LOW (ref 8.9–10.3)
Chloride: 106 mmol/L (ref 98–111)
GFR calc Af Amer: >60 mL/min (ref >60)
GFR calc non Af Amer: >60 mL/min (ref >60)
GLUCOSE: 119 mg/dL — AB (ref 70–99)
Potassium: 3.3 mmol/L — ABNORMAL LOW (ref 3.5–5.1)
Sodium: 138 mmol/L (ref 135–145)
TOTAL PROTEIN: 6.9 g/dL (ref 6.5–8.1)

## 2017-10-10 LAB — CBC WITH DIFFERENTIAL/PLATELET
BASOS ABS: 0 10*3/uL (ref 0.0–0.1)
BASOS PCT: 0 %
Eosinophils Absolute: 0.2 10*3/uL (ref 0.0–0.7)
Eosinophils Relative: 3 %
HEMATOCRIT: 35.3 % — AB (ref 36.0–46.0)
HEMOGLOBIN: 11.6 g/dL — AB (ref 12.0–15.0)
LYMPHS PCT: 41 %
Lymphs Abs: 2.8 10*3/uL (ref 0.7–4.0)
MCH: 27.2 pg (ref 26.0–34.0)
MCHC: 32.9 g/dL (ref 30.0–36.0)
MCV: 82.9 fL (ref 78.0–100.0)
Monocytes Absolute: 0.3 10*3/uL (ref 0.1–1.0)
Monocytes Relative: 4 %
NEUTROS ABS: 3.5 10*3/uL (ref 1.7–7.7)
Neutrophils Relative %: 52 %
Platelets: 351 10*3/uL (ref 150–400)
RBC: 4.26 MIL/uL (ref 3.87–5.11)
RDW: 16.4 % — ABNORMAL HIGH (ref 11.5–15.5)
WBC: 6.8 10*3/uL (ref 4.0–10.5)

## 2017-10-10 NOTE — Progress Notes (Signed)
Post Partum Day 10 readmit for pp preeclampsia Subjective: no complaints Patient denies HA, no blurry vision, no scotomata, no RUQ pain.   Objective: Blood pressure 137/87, pulse 67, temperature 98.6 F (37 C), temperature source Oral, resp. rate 18, height 5\' 6"  (1.676 m), weight 90.3 kg, SpO2 100 %, currently breastfeeding.  Physical Exam:  General: alert, cooperative and no distress Lochia: appropriate Uterine Fundus: firm DVT Evaluation: No evidence of DVT seen on physical exam. Negative Homan's sign. NEURO: 1+ DTRs  Recent Labs    10/09/17 1910 10/10/17 0615  HGB 11.6* 11.6*  HCT 36.0 35.3*    Assessment/Plan: 27 yo with post partum preeclampsia no clinical symptoms BP normal currently On magnesium sulfate for seizure prophylaxis Will continue until 9pm tonight which will be 24 hours Discussed with patient that her BPs much improved and she most likely  Can be discharged in AM.    LOS: 1 day   Matei Magnone STACIA 10/10/2017, 9:12 AM

## 2017-10-11 NOTE — Plan of Care (Signed)
Pt. Condition will continue to improve 

## 2017-10-11 NOTE — Discharge Summary (Signed)
OB Discharge Summary     Patient Name: Kristina Ortiz DOB: Aug 14, 1990 MRN: [id number]  Date of admission: 10/09/2017 Delivering MD: This patient has no babies on file.  Date of discharge: 10/11/2017  Admitting diagnosis: PP HBP Intrauterine pregnancy: Unknown     Secondary diagnosis:  Active Problems:   Postpartum hypertension   Preeclampsia in postpartum period  Additional problems: None     Discharge diagnosis: Postpartum hypertension                                                                                                Post partum procedures:Magnesium Sulfate   Hospital course:  Readmission postpartum for hypertension  Physical exam  Vitals:   10/10/17 2100 10/10/17 2135 10/10/17 2338 10/11/17 0417  BP:  134/90 132/70 120/70  Pulse:   72 67  Resp: 18  16 16   Temp:   98.5 F (36.9 C) 98.6 F (37 C)  TempSrc:   Oral Oral  SpO2:   100% 98%  Weight:      Height:       General: alert, cooperative and no distress Lochia: appropriate Uterine Fundus: firm DVT Evaluation: No evidence of DVT seen on physical exam. Labs: Lab Results  Component Value Date   WBC 6.8 10/10/2017   HGB 11.6 (L) 10/10/2017   HCT 35.3 (L) 10/10/2017   MCV 82.9 10/10/2017   PLT 351 10/10/2017   CMP Latest Ref Rng & Units 10/10/2017  Glucose 70 - 99 mg/dL 829(F)  BUN 6 - 20 mg/dL 9  Creatinine 6.21 - 3.08 mg/dL 6.57  Sodium 846 - 962 mmol/L 138  Potassium 3.5 - 5.1 mmol/L 3.3(L)  Chloride 98 - 111 mmol/L 106  CO2 22 - 32 mmol/L 21(L)  Calcium 8.9 - 10.3 mg/dL 8.1(L)  Total Protein 6.5 - 8.1 g/dL 6.9  Total Bilirubin 0.3 - 1.2 mg/dL 0.4  Alkaline Phos 38 - 126 U/L 96  AST 15 - 41 U/L 26  ALT 0 - 44 U/L 31    Discharge instruction: per After Visit Summary and "Baby and Me Booklet".  After visit meds:  Allergies as of 10/11/2017      Reactions   Cinnamon    Facial swelling      Medication List    STOP taking these medications   glyBURIDE 5 MG tablet Commonly known as:   DIABETA     TAKE these medications   ferrous sulfate 325 (65 FE) MG EC tablet Take 325 mg by mouth daily with breakfast.   ibuprofen 600 MG tablet Commonly known as:  ADVIL,MOTRIN Take 1 tablet (600 mg total) by mouth every 6 (six) hours.   NIFEdipine 30 MG 24 hr tablet Commonly known as:  PROCARDIA-XL/ADALAT CC Take 1 tablet (30 mg total) by mouth daily.   PRENATAL GUMMIES/DHA & FA PO Take 2 capsules by mouth daily.       Diet: routine diet  Activity: Advance as tolerated. Pelvic rest for 6 weeks.   Outpatient follow up:6 weeks Follow up Appt:No future appointments. Follow up Visit:No follow-ups on file.  Postpartum contraception: Undecided  10/11/2017 Kenney HousemanNancy Jean Jemina Scahill, CNM

## 2018-01-23 ENCOUNTER — Other Ambulatory Visit: Payer: Self-pay

## 2018-01-23 ENCOUNTER — Emergency Department (HOSPITAL_BASED_OUTPATIENT_CLINIC_OR_DEPARTMENT_OTHER)
Admission: EM | Admit: 2018-01-23 | Discharge: 2018-01-23 | Disposition: A | Discharge status: Home / Self Care | Payer: BLUE CROSS/BLUE SHIELD | Attending: Emergency Medicine | Admitting: Emergency Medicine

## 2018-01-23 ENCOUNTER — Encounter (HOSPITAL_BASED_OUTPATIENT_CLINIC_OR_DEPARTMENT_OTHER): Payer: Self-pay

## 2018-01-23 ENCOUNTER — Encounter (HOSPITAL_BASED_OUTPATIENT_CLINIC_OR_DEPARTMENT_OTHER): Payer: Self-pay | Admitting: *Deleted

## 2018-01-23 DIAGNOSIS — Z5321 Procedure and treatment not carried out due to patient leaving prior to being seen by health care provider: Secondary | ICD-10-CM | POA: Diagnosis not present

## 2018-01-23 DIAGNOSIS — R112 Nausea with vomiting, unspecified: Secondary | ICD-10-CM

## 2018-01-23 DIAGNOSIS — R1110 Vomiting, unspecified: Secondary | ICD-10-CM | POA: Insufficient documentation

## 2018-01-23 DIAGNOSIS — Z79899 Other long term (current) drug therapy: Secondary | ICD-10-CM

## 2018-01-23 LAB — URINALYSIS, MICROSCOPIC (REFLEX): WBC UA: NONE SEEN WBC/hpf (ref 0–5)

## 2018-01-23 LAB — URINALYSIS, ROUTINE W REFLEX MICROSCOPIC
Bilirubin Urine: NEGATIVE
Glucose, UA: NEGATIVE mg/dL
Ketones, ur: NEGATIVE mg/dL
LEUKOCYTES UA: NEGATIVE
Nitrite: NEGATIVE
Protein, ur: NEGATIVE mg/dL
pH: 6 (ref 5.0–8.0)

## 2018-01-23 LAB — PREGNANCY, URINE: Preg Test, Ur: NEGATIVE

## 2018-01-23 NOTE — ED Triage Notes (Signed)
C/o n/v, lightheaded x today-denies pain-NAD-steady gait

## 2018-01-23 NOTE — ED Triage Notes (Signed)
N/V/ fever that started today.  Denies pain

## 2018-01-24 ENCOUNTER — Emergency Department (HOSPITAL_BASED_OUTPATIENT_CLINIC_OR_DEPARTMENT_OTHER)
Admission: EM | Admit: 2018-01-24 | Discharge: 2018-01-24 | Disposition: A | Discharge status: Home / Self Care | Payer: BLUE CROSS/BLUE SHIELD | Source: Home / Self Care | Attending: Emergency Medicine | Admitting: Emergency Medicine

## 2018-01-24 DIAGNOSIS — R112 Nausea with vomiting, unspecified: Secondary | ICD-10-CM

## 2018-01-24 LAB — COMPREHENSIVE METABOLIC PANEL
ALBUMIN: 4.7 g/dL (ref 3.5–5.0)
ALT: 38 U/L (ref 0–44)
AST: 25 U/L (ref 15–41)
Alkaline Phosphatase: 60 U/L (ref 38–126)
Anion gap: 9 (ref 5–15)
BILIRUBIN TOTAL: 0.6 mg/dL (ref 0.3–1.2)
BUN: 16 mg/dL (ref 6–20)
CO2: 22 mmol/L (ref 22–32)
Calcium: 9.7 mg/dL (ref 8.9–10.3)
Chloride: 106 mmol/L (ref 98–111)
Creatinine, Ser: 0.85 mg/dL (ref 0.44–1.00)
GFR calc Af Amer: >60 mL/min (ref >60)
Glucose, Bld: 124 mg/dL — ABNORMAL HIGH (ref 70–99)
POTASSIUM: 3.9 mmol/L (ref 3.5–5.1)
Sodium: 137 mmol/L (ref 135–145)
TOTAL PROTEIN: 8.6 g/dL — AB (ref 6.5–8.1)

## 2018-01-24 LAB — CBC
HEMATOCRIT: 40.8 % (ref 36.0–46.0)
HEMOGLOBIN: 13.3 g/dL (ref 12.0–15.0)
MCH: 27.4 pg (ref 26.0–34.0)
MCHC: 32.6 g/dL (ref 30.0–36.0)
MCV: 84 fL (ref 80.0–100.0)
Platelets: 281 10*3/uL (ref 150–400)
RBC: 4.86 MIL/uL (ref 3.87–5.11)
RDW: 14.5 % (ref 11.5–15.5)
WBC: 6.8 10*3/uL (ref 4.0–10.5)
nRBC: 0 % (ref 0.0–0.2)

## 2018-01-24 LAB — LIPASE, BLOOD: Lipase: 39 U/L (ref 11–51)

## 2018-01-24 MED ORDER — ONDANSETRON 4 MG PO TBDP: 4.0000 mg | ORAL_TABLET | Freq: Three times a day (TID) | ORAL | 0 refills | Status: DC | PRN

## 2018-01-24 MED ORDER — ONDANSETRON HCL 4 MG/2ML IJ SOLN
4.0000 mg | Freq: Once | INTRAMUSCULAR | Status: AC
  Administered 2018-01-24: 4 mg via INTRAVENOUS
  Filled 2018-01-24: qty 2

## 2018-01-24 NOTE — ED Notes (Signed)
C/o n/v, dizziness x 1 day  Denies pain

## 2018-01-24 NOTE — ED Notes (Signed)
Pt states she is feeling better   Able to drink ginger ale

## 2018-01-24 NOTE — ED Provider Notes (Signed)
[facility] EMERGENCY DEPARTMENT Provider Note   CSN: [id number] Arrival date & time: 01/23/18  2323     History   Chief Complaint Chief Complaint  Patient presents with  . Emesis    HPI Kristina Ortiz is a 27 y.o. female.  HPI  This is a 27 year old female who presents with nausea and vomiting.  Patient reports onset of symptoms while at work.  She reports multiple episodes of nonbilious, nonbloody emesis.  She states that during this time she felt lightheaded.  She denies any diarrhea.  No fevers or chills.  She denies abdominal pain.  She does not think she is pregnant as she just had a baby in August and is on birth control.  She denies any urinary symptoms.  She states she feels somewhat better.  She did eat some crackers prior to arrival and was able to keep them down.  Past Medical History:  Diagnosis Date  . Late prenatal care   . Medical history non-contributory   . Vaginal Pap smear, abnormal     Patient Active Problem List   Diagnosis Date Noted  . 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 Preeclampsia in postpartum period 10/09/2017  . Pregnancy 09/29/2017  . Abnormal glucose tolerance test (GTT) during pregnancy, antepartum 08/07/2017  . Supervision of normal first pregnancy, antepartum 04/25/2017  . Seasonal allergic rhinitis due to pollen 08/20/2016    Past Surgical History:  Procedure Laterality Date  . TONSILLECTOMY    . WISDOM TOOTH EXTRACTION       OB History    Gravida  1   Para  1   Term  1   Preterm      AB      Living  1     SAB      TAB      Ectopic      Multiple  0   Live Births  1            Home Medications    Prior to Admission medications   Medication Sig Start Date End Date Taking? Authorizing Provider  ferrous sulfate 325 (65 FE) MG EC tablet Take 325 mg by mouth daily with breakfast.     [provider]  ibuprofen (ADVIL,MOTRIN) 600 MG tablet Take 1 tablet (600 mg total) by mouth every 6 (six)  hours. 10/02/17   Clemmons, Elmore Guise, CNM  NIFEdipine (PROCARDIA-XL/ADALAT CC) 30 MG 24 hr tablet Take 1 tablet (30 mg total) by mouth daily. 10/09/17   Janeece Riggers, CNM  ondansetron (ZOFRAN ODT) 4 MG disintegrating tablet Take 1 tablet (4 mg total) by mouth every 8 (eight) hours as needed for nausea or vomiting. 01/24/18   Diera Wirkkala, Mayer Masker, MD  Prenatal MV-Min-FA-Omega-3 (PRENATAL GUMMIES/DHA & FA PO) Take 2 capsules by mouth daily.    [provider]    Family History Family History  Problem Relation Age of Onset  . Hypertension Mother   . Hypertension Father   . COPD Paternal Aunt     Social History Social History   Tobacco Use  . Smoking status: Never Smoker  . Smokeless tobacco: Never Used  Substance Use Topics  . Alcohol use: Yes    Comment: occ  . Drug use: No     Allergies   Cinnamon   Review of Systems Review of Systems  Constitutional: Negative for fever.  Respiratory: Negative for shortness of breath.   Cardiovascular: Negative for chest pain.  Gastrointestinal: Positive for nausea and  vomiting. Negative for abdominal pain, constipation and diarrhea.  Genitourinary: Negative for dysuria.  All other systems reviewed and are negative.    Physical Exam Updated Vital Signs BP (!) 157/94 (BP Location: Left Arm)   Pulse 87   Temp 98.7 F (37.1 C) (Oral)   Resp 18   Ht 1.676 m (5\' 6" )   Wt 94.5 kg   SpO2 100%   BMI 33.64 kg/m   Physical Exam  Constitutional: She is oriented to person, place, and time. She appears well-developed and well-nourished. No distress.  HENT:  Head: Normocephalic and atraumatic.  Eyes: Pupils are equal, round, and reactive to light.  Cardiovascular: Normal rate, regular rhythm and normal heart sounds.  Pulmonary/Chest: Effort normal and breath sounds normal. No respiratory distress. She has no wheezes.  Abdominal: Soft. Bowel sounds are normal. There is no tenderness. There is no guarding.  Neurological: She is alert  and oriented to person, place, and time.  Skin: Skin is warm and dry.  Psychiatric: She has a normal mood and affect.  Nursing note and vitals reviewed.    ED Treatments / Results  Labs (all labs ordered are listed, but only abnormal results are displayed) Labs Reviewed  COMPREHENSIVE METABOLIC PANEL - Abnormal; Notable for the following components:      Result Value   Glucose, Bld 124 (*)    Total Protein 8.6 (*)    All other components within normal limits  URINALYSIS, ROUTINE W REFLEX MICROSCOPIC - Abnormal; Notable for the following components:   Specific Gravity, Urine >1.030 (*)    Hgb urine dipstick SMALL (*)    All other components within normal limits  URINALYSIS, MICROSCOPIC (REFLEX) - Abnormal; Notable for the following components:   Bacteria, UA RARE (*)    All other components within normal limits  LIPASE, BLOOD  CBC  PREGNANCY, URINE    EKG None  Radiology No results found.  Procedures Procedures (including critical care time)  Medications Ordered in ED Medications  ondansetron (ZOFRAN) injection 4 mg (4 mg Intravenous Given 01/24/18 0209)     Initial Impression / Assessment and Plan / ED Course  I have reviewed the triage vital signs and the nursing notes.  Pertinent labs & imaging results that were available during my care of the patient were reviewed by me and considered in my medical decision making (see chart for details).     She presents with nausea and vomiting.  She is overall nontoxic-appearing.  No abdominal pain or diarrhea.  Onset of symptoms prior to arrival while at work.  She appears well-hydrated.  Lab work reviewed from triage.  Pregnancy test is negative.  No ketones in the urine to suggest dehydration.  Sick lab work including LFT and lipase are reassuring.  Suspect gastritis versus early gastroenteritis.  Likely viral etiology and benign exam.  Patient was given Zofran.  She is able to orally hydrate without difficulty.  Will  discharge home with Zofran.  After history, exam, and medical workup I feel the patient has been appropriately medically screened and is safe for discharge home. Pertinent diagnoses were discussed with the patient. Patient was given return precautions.   Final Clinical Impressions(s) / ED Diagnoses   Final diagnoses:  Non-intractable vomiting with nausea, unspecified vomiting type    ED Discharge Orders         Ordered    ondansetron (ZOFRAN ODT) 4 MG disintegrating tablet  Every 8 hours PRN     01/24/18 [id number]  Shon Baton, MD 01/24/18 [phone]

## 2018-01-24 NOTE — ED Notes (Signed)
Pt given ginger ale for po challenge 

## 2018-05-05 IMAGING — US US OB COMP LESS 14 WK
1 series · 10 of 10 positions shown · non-contrast
Comparison: None.

CLINICAL DATA: Pelvic pain for 24 hours. Estimated gestational age
by LMP is 10 weeks 1 day. Quantitative beta HCG was not collected.

EXAM:
OBSTETRIC <14 WK ULTRASOUND
TECHNIQUE: Transabdominal ultrasound was performed for evaluation of the
gestation as well as the maternal uterus and adnexal regions.

[Series 1: us ob comp less 14 wk · 0.20mm/px · 10 acquisitions, 10 frames shown]
[im 1/10]
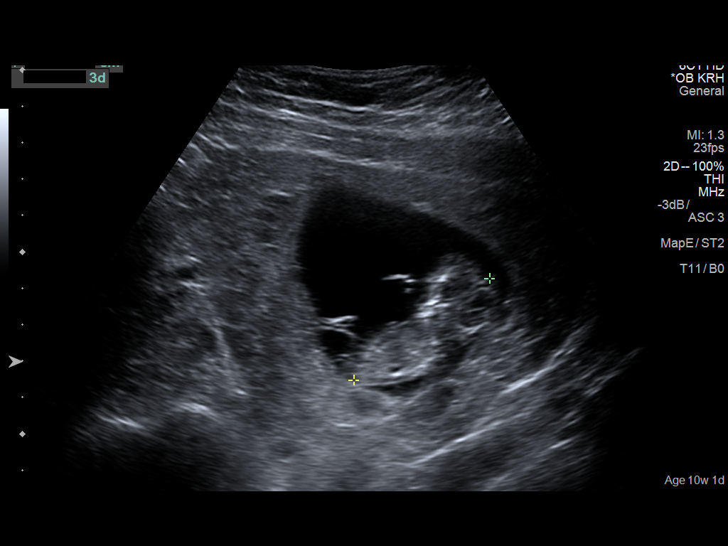
[im 2/10]
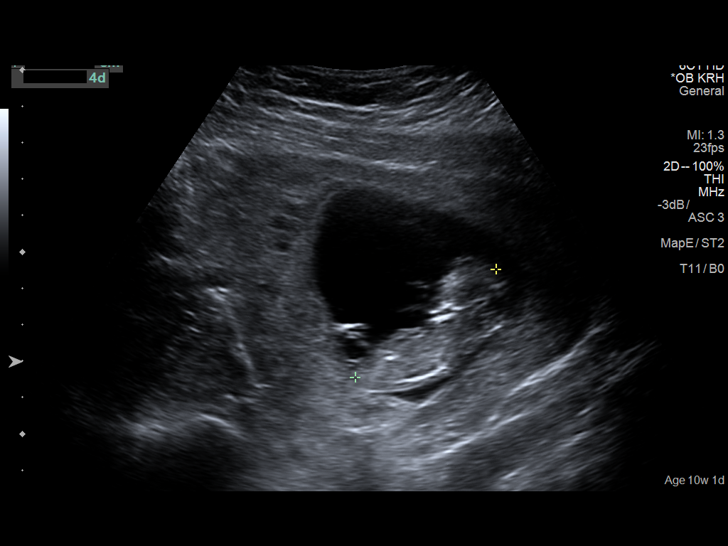
[im 3/10]
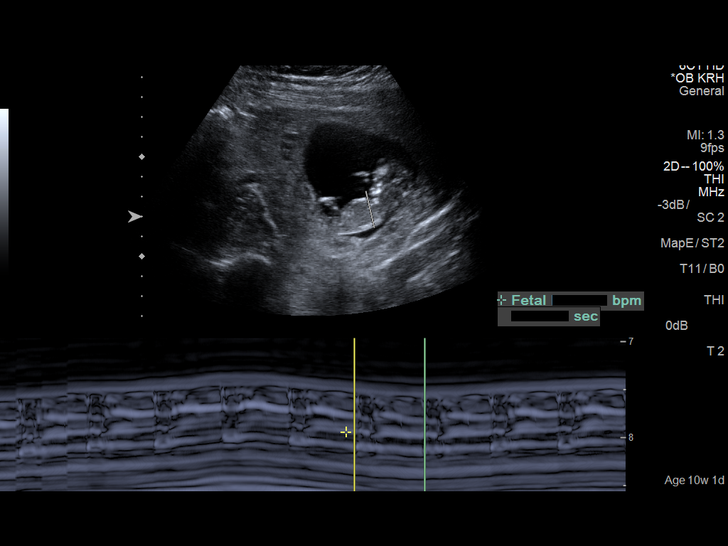
[im 4/10]
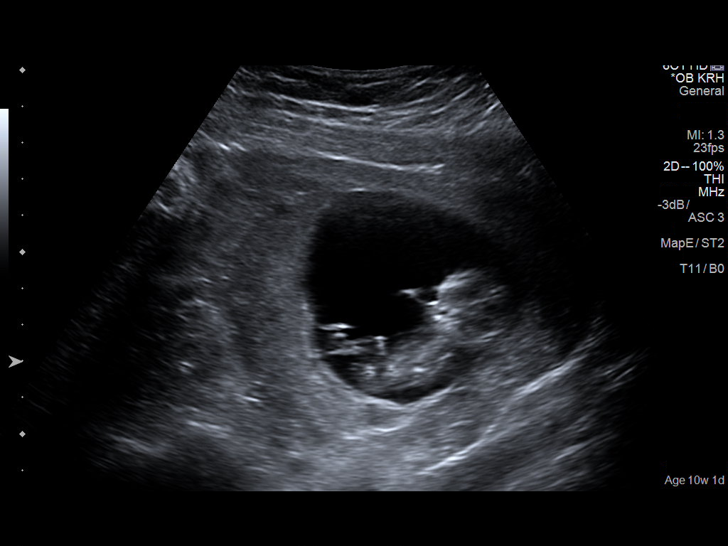
[im 5/10]
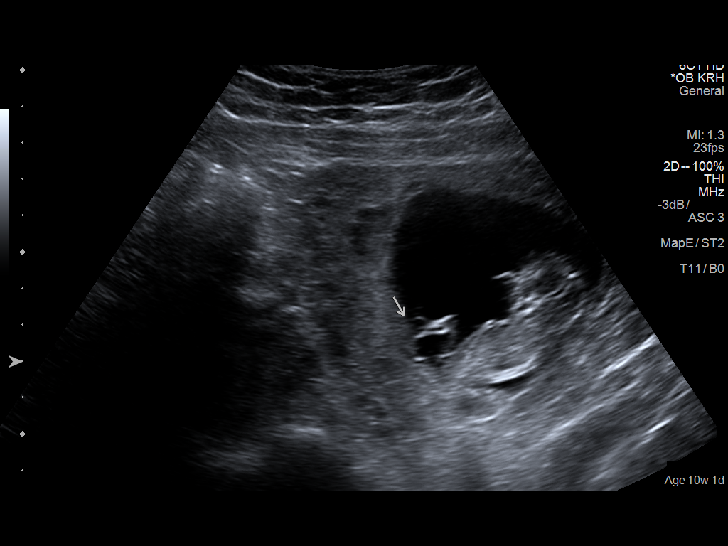
[im 6/10]
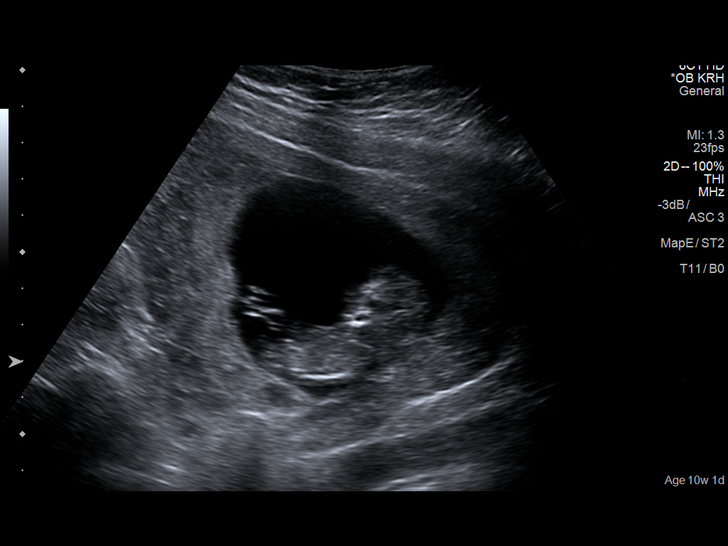
[im 7/10]
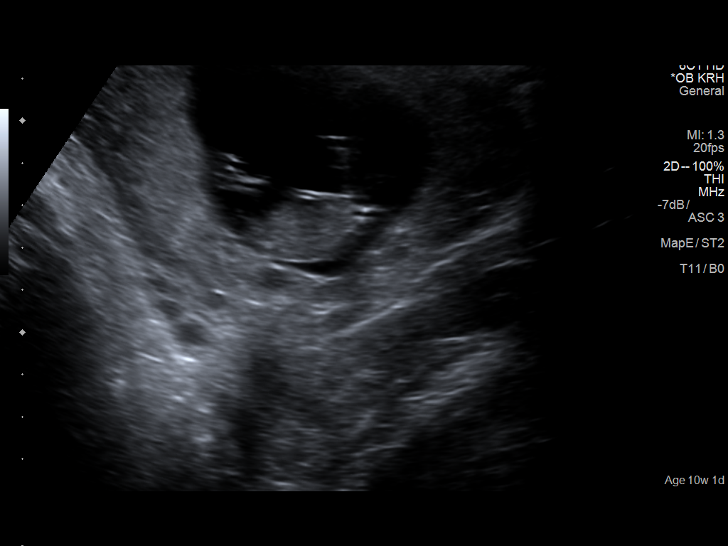
[im 8/10]
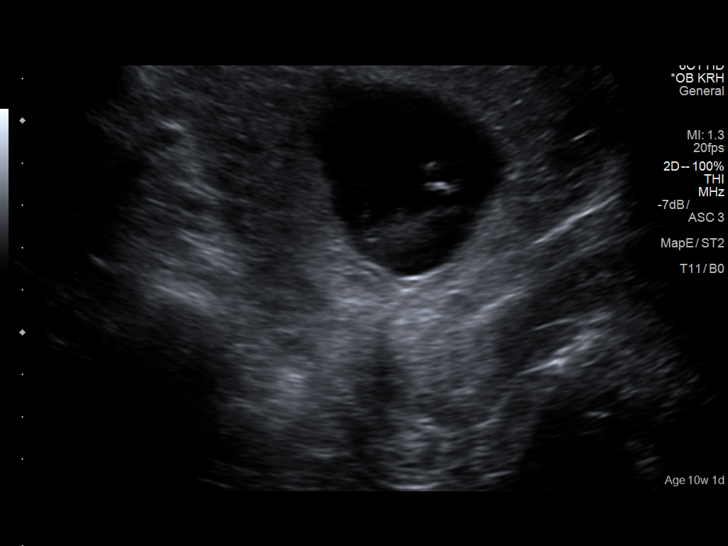
[im 9/10]
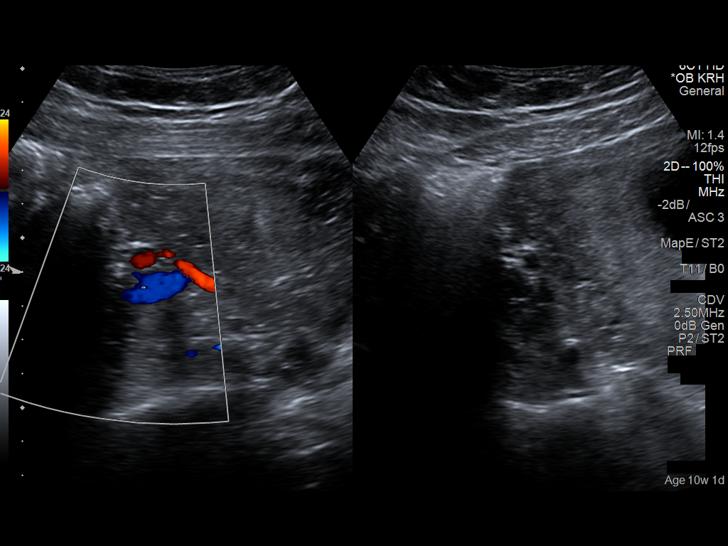
[im 10/10]
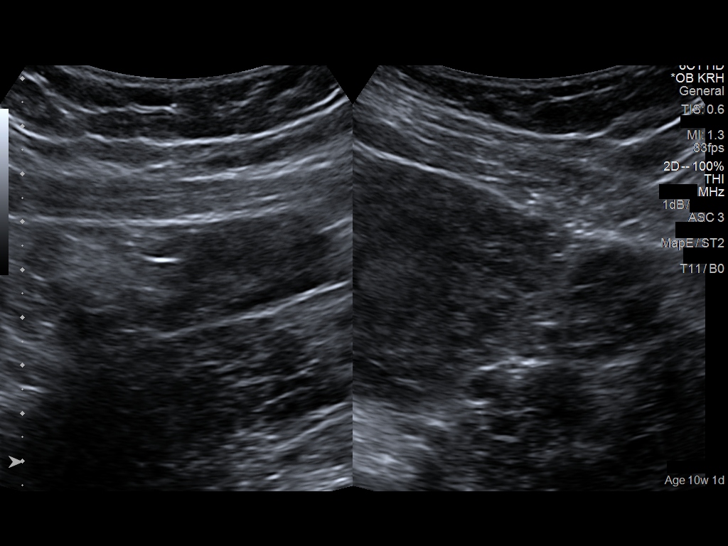

[10 of 10 positions shown; findings below may reference images not displayed]

FINDINGS: Intrauterine gestational sac: A single intrauterine gestational sac
is identified.

Yolk sac:  Yolk sac is present.

Embryo:  Fetal pole is present.

Cardiac Activity: Fetal cardiac activity is observed.

Heart Rate: 145 bpm

CRL: 47.7 mm   11 w 4 d                  US EDC: 10/05/2017

Subchorionic hemorrhage:  None visualized.

Maternal uterus/adnexae: No myometrial mass lesions demonstrated.
The left ovary is visualized and appears normal. Right ovary is not
identified but no abnormal adnexal masses are seen in the visualized
field of view. No free fluid in the pelvis.
IMPRESSION: Single living intrauterine pregnancy with estimated gestational age
by crown-rump length of 11 weeks 4 days. No acute complication is
demonstrated sonographically.

## 2018-10-17 IMAGING — CR DG CHEST 2V
2 series · 2 of 2 positions shown · non-contrast
Comparison: 12/20/2015

CLINICAL DATA: Productive cough and fever for 1 week

EXAM:
CHEST  2 VIEW

[w chest pa]
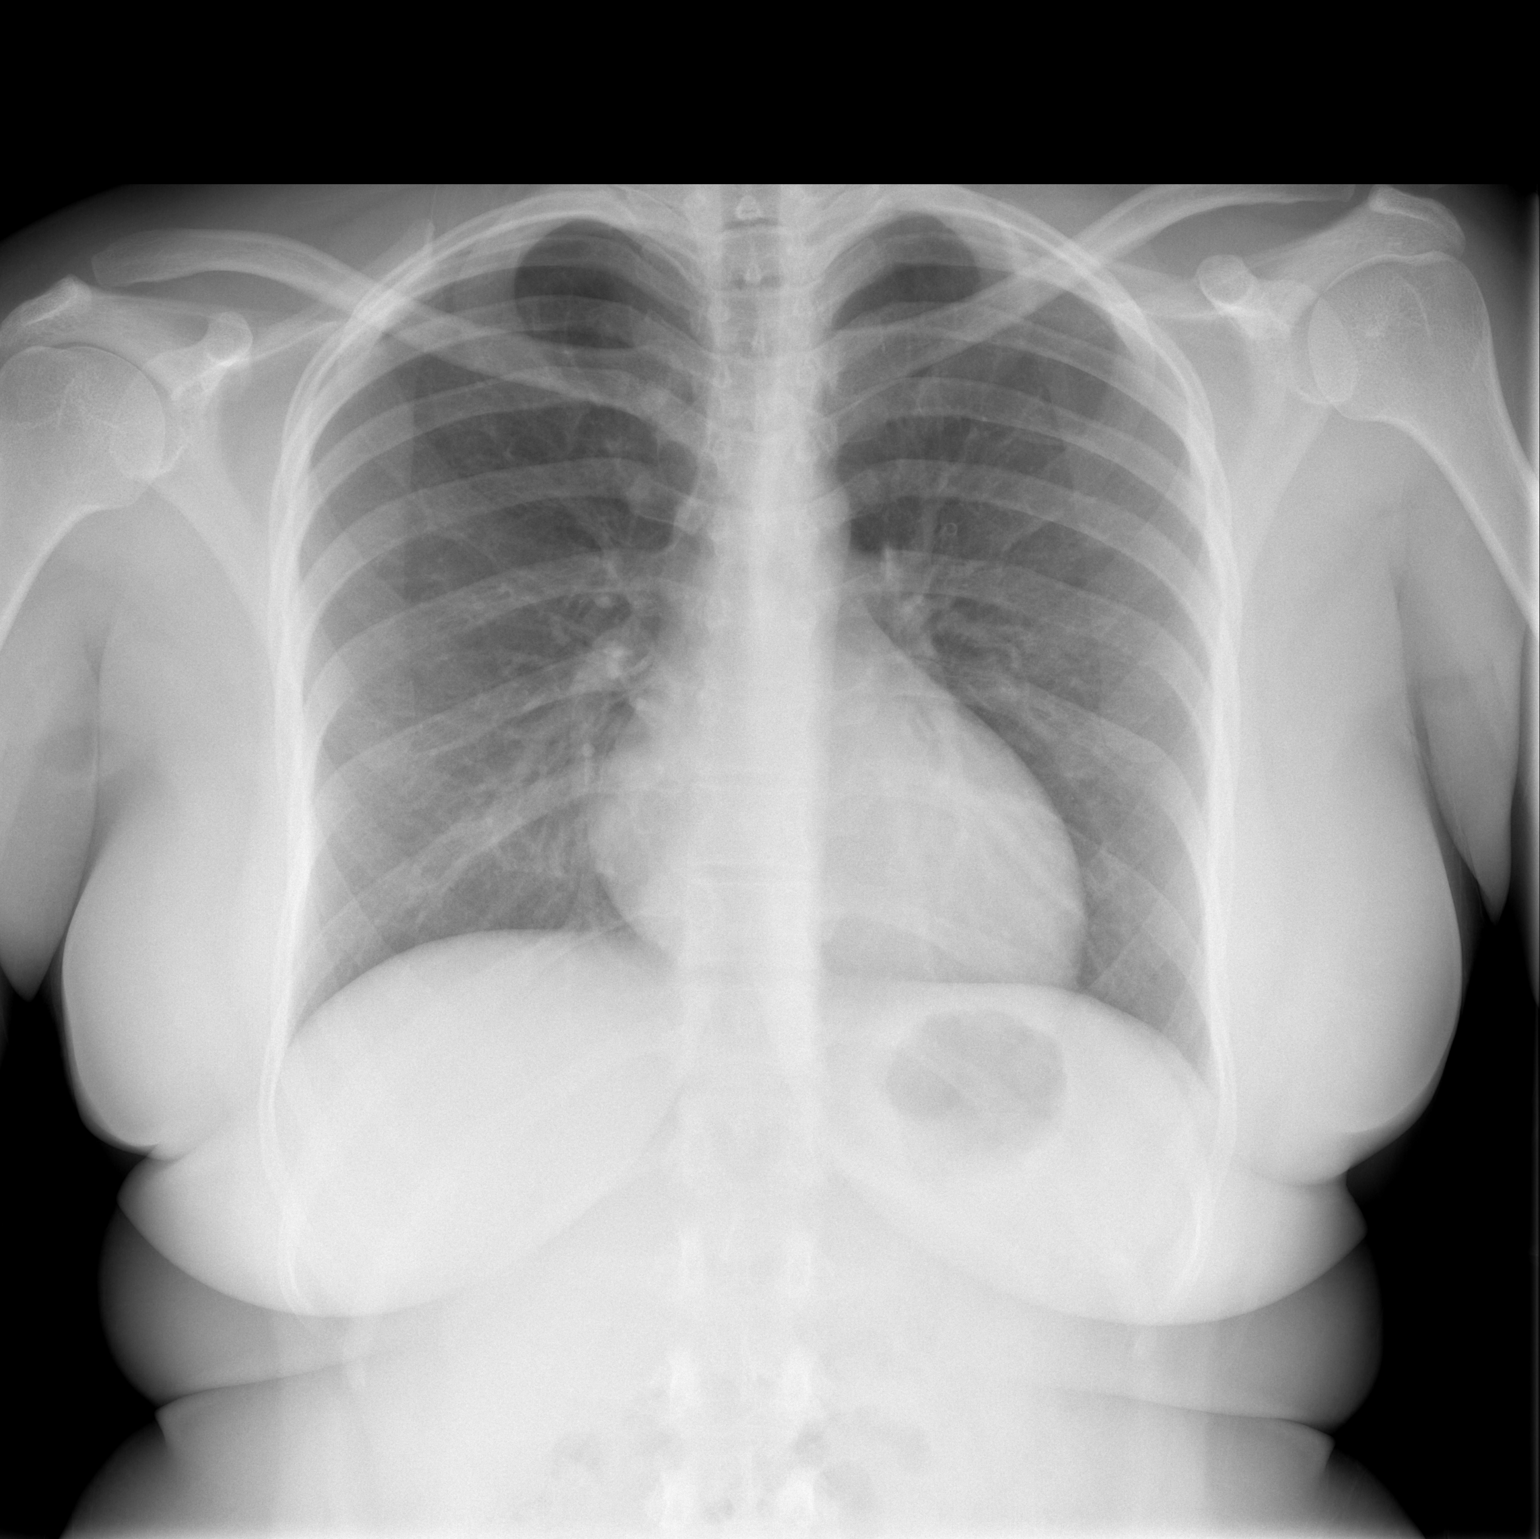

[w chest lat]
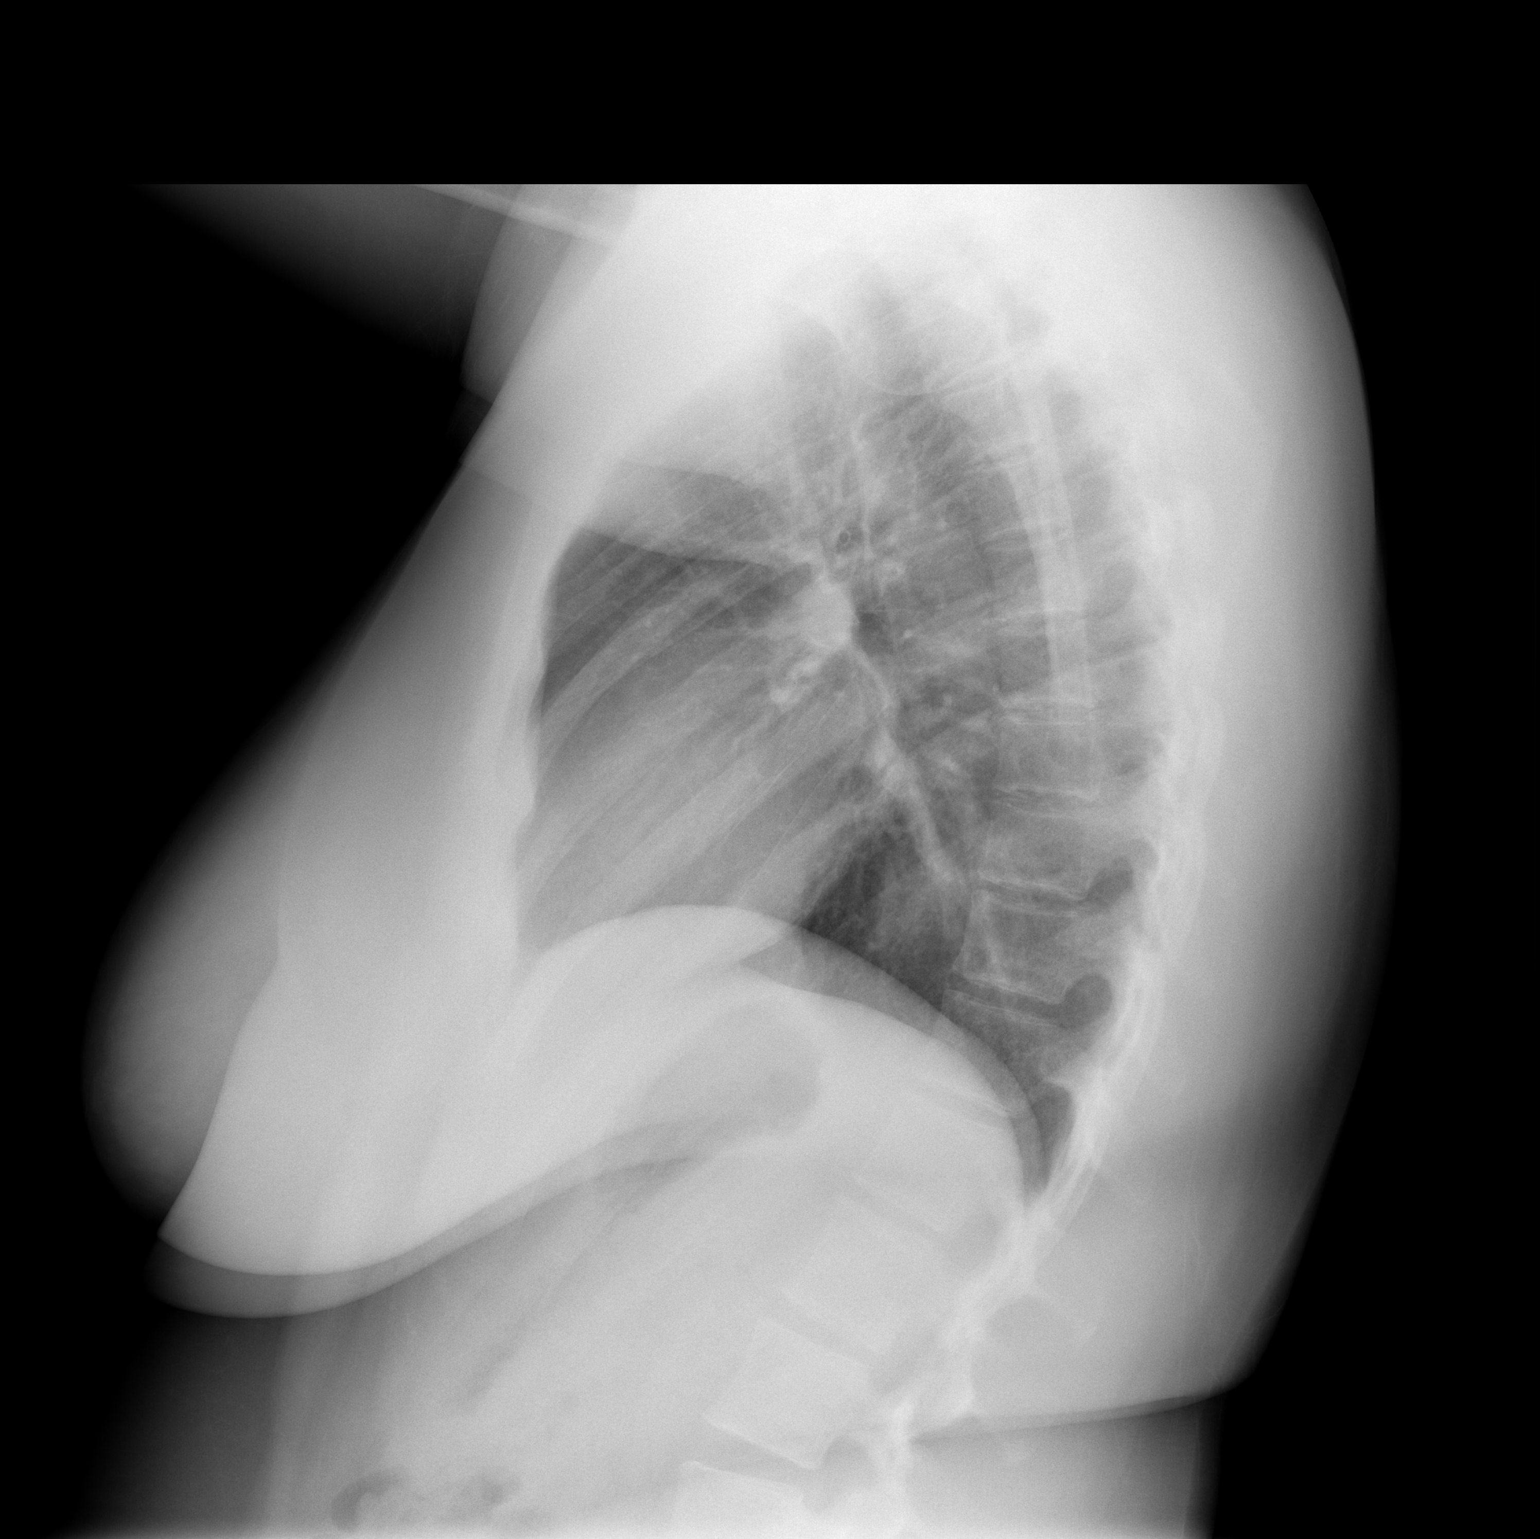

[2 of 2 positions shown; findings below may reference images not displayed]

FINDINGS: The heart size and mediastinal contours are within normal limits.
Both lungs are clear. The visualized skeletal structures are
unremarkable.
IMPRESSION: No active cardiopulmonary disease.

## 2019-02-27 ENCOUNTER — Other Ambulatory Visit: Payer: Self-pay

## 2019-02-27 ENCOUNTER — Encounter (HOSPITAL_BASED_OUTPATIENT_CLINIC_OR_DEPARTMENT_OTHER): Payer: Self-pay

## 2019-02-27 ENCOUNTER — Emergency Department (HOSPITAL_BASED_OUTPATIENT_CLINIC_OR_DEPARTMENT_OTHER): Payer: Medicaid Other

## 2019-02-27 ENCOUNTER — Emergency Department (HOSPITAL_BASED_OUTPATIENT_CLINIC_OR_DEPARTMENT_OTHER)
Admission: EM | Admit: 2019-02-27 | Discharge: 2019-02-27 | Disposition: A | Discharge status: Home / Self Care | Payer: Medicaid Other | Attending: Emergency Medicine | Admitting: Emergency Medicine

## 2019-02-27 DIAGNOSIS — Z79899 Other long term (current) drug therapy: Secondary | ICD-10-CM | POA: Diagnosis not present

## 2019-02-27 DIAGNOSIS — R0789 Other chest pain: Secondary | ICD-10-CM

## 2019-02-27 LAB — CBC
HCT: 38 % (ref 36.0–46.0)
Hemoglobin: 12.3 g/dL (ref 12.0–15.0)
MCH: 26.7 pg (ref 26.0–34.0)
MCHC: 32.4 g/dL (ref 30.0–36.0)
MCV: 82.6 fL (ref 80.0–100.0)
Platelets: 257 10*3/uL (ref 150–400)
RBC: 4.6 MIL/uL (ref 3.87–5.11)
RDW: 14.5 % (ref 11.5–15.5)
WBC: 5.2 10*3/uL (ref 4.0–10.5)
nRBC: 0 % (ref 0.0–0.2)

## 2019-02-27 LAB — BASIC METABOLIC PANEL
Anion gap: 9 (ref 5–15)
BUN: 13 mg/dL (ref 6–20)
CO2: 22 mmol/L (ref 22–32)
Calcium: 8.8 mg/dL — ABNORMAL LOW (ref 8.9–10.3)
Chloride: 107 mmol/L (ref 98–111)
Creatinine, Ser: 0.85 mg/dL (ref 0.44–1.00)
GFR calc Af Amer: >60 mL/min (ref >60)
GFR calc non Af Amer: >60 mL/min (ref >60)
Glucose, Bld: 99 mg/dL (ref 70–99)
Potassium: 3.5 mmol/L (ref 3.5–5.1)
Sodium: 138 mmol/L (ref 135–145)

## 2019-02-27 LAB — TROPONIN I (HIGH SENSITIVITY)
Troponin I (High Sensitivity): <2 ng/L (ref <18)
Troponin I (High Sensitivity): <2 ng/L (ref <18)

## 2019-02-27 LAB — PREGNANCY, URINE: Preg Test, Ur: NEGATIVE

## 2019-02-27 MED ORDER — NAPROXEN 500 MG PO TABS: 500.0000 mg | ORAL_TABLET | Freq: Two times a day (BID) | ORAL | 0 refills | Status: DC

## 2019-02-27 MED ORDER — KETOROLAC TROMETHAMINE 30 MG/ML IJ SOLN
30.0000 mg | Freq: Once | INTRAMUSCULAR | Status: AC
  Administered 2019-02-27: 30 mg via INTRAVENOUS
  Filled 2019-02-27: qty 1

## 2019-02-27 NOTE — ED Triage Notes (Signed)
Pt arrives to ED with c/o CP X 3 days, denies any radiation.

## 2019-02-27 NOTE — ED Triage Notes (Signed)
Pt took 4 ASA PTA

## 2019-02-27 NOTE — ED Provider Notes (Signed)
[facility] EMERGENCY DEPARTMENT Provider Note   CSN: [id number] Arrival date & time: 02/27/19  1104     History Chief Complaint  Patient presents with  . Chest Pain    Kristina Ortiz is a 29 y.o. female.  Kristina Ortiz is a 29 y.o. female who is otherwise healthy, presents to the ED for evaluation of chest pain over the past 3 days.  Patient states chest pain is a sharp intermittent pain that is well localized to the right side of her sternum.  She reports pain initially started while she was at work she reports it occurs intermittently and is sometimes worse with deep breath or certain movements.  She denies any associated shortness of breath.  No cough or fever.  She denies any syncope.  No associated abdominal pain, nausea, vomiting or diaphoresis.  She denies history of similar pain before.  No lower extremity swelling or edema, no recent long distance travel or surgeries, she uses an IUD for birth control and is not on any estrogen supplementation.  No history of PE or DVT.  She states that she took some aspirin to treat her symptoms without improvement, no other medications prior to arrival.        Past Medical History:  Diagnosis Date  . Late prenatal care   . Medical history non-contributory   . Vaginal Pap smear, abnormal     Patient Active Problem List   Diagnosis Date Noted  . 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 Preeclampsia in postpartum period 10/09/2017  . Pregnancy 09/29/2017  . Abnormal glucose tolerance test (GTT) during pregnancy, antepartum 08/07/2017  . Supervision of normal first pregnancy, antepartum 04/25/2017  . Seasonal allergic rhinitis due to pollen 08/20/2016    Past Surgical History:  Procedure Laterality Date  . TONSILLECTOMY    . WISDOM TOOTH EXTRACTION       OB History    Gravida  1   Para  1   Term  1   Preterm      AB      Living  1     SAB      TAB      Ectopic      Multiple  0   Live Births  1             Family History  Problem Relation Age of Onset  . Hypertension Mother   . Hypertension Father   . COPD Paternal Aunt     Social History   Tobacco Use  . Smoking status: Never Smoker  . Smokeless tobacco: Never Used  Substance Use Topics  . Alcohol use: Yes    Comment: occ  . Drug use: No    Home Medications Prior to Admission medications   Medication Sig Start Date End Date Taking? Authorizing Provider  ferrous sulfate 325 (65 FE) MG EC tablet Take 325 mg by mouth daily with breakfast.     [provider]  ibuprofen (ADVIL,MOTRIN) 600 MG tablet Take 1 tablet (600 mg total) by mouth every 6 (six) hours. 10/02/17   Clemmons, Lori A, CNM  naproxen (NAPROSYN) 500 MG tablet Take 1 tablet (500 mg total) by mouth 2 (two) times daily. 02/27/19   Dartha Lodge, PA-C  NIFEdipine (PROCARDIA-XL/ADALAT CC) 30 MG 24 hr tablet Take 1 tablet (30 mg total) by mouth daily. 10/09/17   Janeece Riggers, CNM  ondansetron (ZOFRAN ODT) 4 MG disintegrating tablet Take 1 tablet (4 mg total) by mouth  every 8 (eight) hours as needed for nausea or vomiting. 01/24/18   Horton, Barbette Hair, MD  Prenatal MV-Min-FA-Omega-3 (PRENATAL GUMMIES/DHA & FA PO) Take 2 capsules by mouth daily.    [provider]    Allergies    Cinnamon  Review of Systems   Review of Systems  Constitutional: Negative for chills and fever.  HENT: Negative.   Respiratory: Negative for cough and shortness of breath.   Cardiovascular: Positive for chest pain. Negative for palpitations and leg swelling.  Gastrointestinal: Negative for abdominal pain, nausea and vomiting.  Genitourinary: Negative for dysuria.  Musculoskeletal: Negative for arthralgias and myalgias.  Skin: Negative for color change and rash.  Neurological: Negative for dizziness, syncope and light-headedness.    Physical Exam Updated Vital Signs BP 131/71   Pulse (!) 51   Temp 98.7 F (37.1 C) (Oral)   Resp 17   Ht 5\' 6"  (1.676 m)   Wt 95.7  kg   LMP 02/27/2019   SpO2 100%   BMI 34.06 kg/m   Physical Exam Vitals and nursing note reviewed.  Constitutional:      General: She is not in acute distress.    Appearance: She is well-developed and normal weight. She is not ill-appearing or diaphoretic.  HENT:     Head: Normocephalic and atraumatic.  Eyes:     General:        Right eye: No discharge.        Left eye: No discharge.     Pupils: Pupils are equal, round, and reactive to light.  Cardiovascular:     Rate and Rhythm: Normal rate and regular rhythm.     Pulses:          Radial pulses are 2+ on the right side and 2+ on the left side.     Heart sounds: Normal heart sounds. No murmur. No friction rub. No gallop.   Pulmonary:     Effort: Pulmonary effort is normal. No respiratory distress.     Breath sounds: Normal breath sounds. No wheezing or rales.     Comments: Respirations equal and unlabored, patient able to speak in full sentences, lungs clear to auscultation bilaterally Chest:     Chest wall: Tenderness present.     Comments: Chest pain is consistently reproducible with palpation over the right costochondral margin, no overlying skin changes or palpable deformity Abdominal:     General: Bowel sounds are normal. There is no distension.     Palpations: Abdomen is soft. There is no mass.     Tenderness: There is no abdominal tenderness. There is no guarding.     Comments: Respirations equal and unlabored, patient able to speak in full sentences, lungs clear to auscultation bilaterally  Musculoskeletal:        General: No deformity.     Cervical back: Neck supple.     Right lower leg: No tenderness. No edema.     Left lower leg: No tenderness. No edema.     Comments: Bilateral lower extremities warm and well perfused without edema or tenderness.  Skin:    General: Skin is warm and dry.     Capillary Refill: Capillary refill takes less than 2 seconds.  Neurological:     Mental Status: She is alert and oriented  to person, place, and time.     Coordination: Coordination normal.     Comments: Speech is clear, able to follow commands Moves extremities without ataxia, coordination intact  Psychiatric:  Mood and Affect: Mood normal.        Behavior: Behavior normal.     ED Results / Procedures / Treatments   Labs (all labs ordered are listed, but only abnormal results are displayed) Labs Reviewed  BASIC METABOLIC PANEL - Abnormal; Notable for the following components:      Result Value   Calcium 8.8 (*)    All other components within normal limits  CBC  PREGNANCY, URINE  TROPONIN I (HIGH SENSITIVITY)  TROPONIN I (HIGH SENSITIVITY)    EKG EKG Interpretation  Date/Time:  Saturday February 27 2019 11:26:45 EST Ventricular Rate:  60 PR Interval:  150 QRS Duration: 78 QT Interval:  422 QTC Calculation: 422 R Axis:   85 Text Interpretation: Sinus rhythm with marked sinus arrhythmia Nonspecific T wave abnormality Abnormal ECG When compared to prior, new t wave inversions in leads 3 and V3. No STEMI Confirmed by Theda Belfast ([id number]) on 02/27/2019 1:34:32 PM   Radiology DG Chest 2 View  Result Date: 02/27/2019 CLINICAL DATA:  Chest pain EXAM: CHEST - 2 VIEW COMPARISON:  February 25, 2016 FINDINGS: Lungs are clear. Heart size and pulmonary vascularity are normal. No adenopathy. No pneumothorax. No bone lesions. IMPRESSION: No abnormality noted. Electronically Signed   By: Bretta Bang III M.D.   On: 02/27/2019 11:51    Procedures Procedures (including critical care time)  Medications Ordered in ED Medications  ketorolac (TORADOL) 30 MG/ML injection 30 mg (30 mg Intravenous Given 02/27/19 1434)    ED Course  I have reviewed the triage vital signs and the nursing notes.  Pertinent labs & imaging results that were available during my care of the patient were reviewed by me and considered in my medical decision making (see chart for details).    MDM Rules/Calculators/A&P                       Patient presents with well localized right-sided chest pain that is reproducible on palpation chest pain is not likely of cardiac or pulmonary etiology d/t presentation, PERC negative, VSS, no tracheal deviation, no JVD or new murmur, RRR, breath sounds equal bilaterally, EKG without acute abnormalities, negative troponin, and negative CXR. Given reassuring work-up and that pain is consistently reproducible with palpation over the right costochondral margin suspect costochondritis as cause we will treat with NSAIDs, patient was treated with Toradol injection here in the ED with significant improvement in her pain.  Patient is to be discharged with recommendation to follow up with PCP in regards to today's hospital visit.  Pt has been advised to return to the ED if CP becomes exertional, associated with diaphoresis or nausea, radiates to left jaw/arm, worsens or becomes concerning in any way. Pt appears reliable for follow up and is agreeable to discharge.    Final Clinical Impression(s) / ED Diagnoses Final diagnoses:  Atypical chest pain  Chest wall pain    Rx / DC Orders ED Discharge Orders         Ordered    naproxen (NAPROSYN) 500 MG tablet  2 times daily     02/27/19 1532           Jodi Geralds [place], [place] 02/27/19 1555    Tegeler, Canary Brim, MD 02/27/19 [phone]

## 2019-02-27 NOTE — Discharge Instructions (Signed)
Your work-up today is reassuring, I suspect this is musculoskeletal chest wall pain caused by inflammation of the cartilage between your ribs and breastbone.  Please take naproxen twice daily with food, you can also use Tylenol, as well as over-the-counter lidocaine patches to treat pain, if this is not improving over the next week please follow-up with your primary care doctor.  If you develop new or worsening symptoms return to the ED.

## 2019-11-29 ENCOUNTER — Emergency Department (HOSPITAL_BASED_OUTPATIENT_CLINIC_OR_DEPARTMENT_OTHER)
Admission: EM | Admit: 2019-11-29 | Discharge: 2019-11-29 | Disposition: A | Discharge status: Home / Self Care | Payer: BC Managed Care – PPO | Attending: Emergency Medicine | Admitting: Emergency Medicine

## 2019-11-29 ENCOUNTER — Encounter (HOSPITAL_BASED_OUTPATIENT_CLINIC_OR_DEPARTMENT_OTHER): Payer: Self-pay | Admitting: *Deleted

## 2019-11-29 ENCOUNTER — Other Ambulatory Visit: Payer: Self-pay

## 2019-11-29 DIAGNOSIS — R519 Headache, unspecified: Secondary | ICD-10-CM | POA: Insufficient documentation

## 2019-11-29 DIAGNOSIS — R0789 Other chest pain: Secondary | ICD-10-CM | POA: Diagnosis not present

## 2019-11-29 DIAGNOSIS — J069 Acute upper respiratory infection, unspecified: Secondary | ICD-10-CM | POA: Diagnosis not present

## 2019-11-29 DIAGNOSIS — R509 Fever, unspecified: Secondary | ICD-10-CM | POA: Diagnosis present

## 2019-11-29 DIAGNOSIS — Z20822 Contact with and (suspected) exposure to covid-19: Secondary | ICD-10-CM | POA: Diagnosis not present

## 2019-11-29 LAB — RESPIRATORY PANEL BY RT PCR (FLU A&B, COVID)
Influenza A by PCR: NEGATIVE
Influenza B by PCR: NEGATIVE
SARS Coronavirus 2 by RT PCR: NEGATIVE

## 2019-11-29 NOTE — ED Triage Notes (Addendum)
Chest pain to right side since last night.  No smell since yesterday.  Stated that she can taste. Fever of 101.2 last night. Migraine headache.

## 2019-11-29 NOTE — ED Provider Notes (Signed)
[facility] EMERGENCY DEPARTMENT Provider Note   CSN: [id number] Arrival date & time: 11/29/19  1025     History Chief Complaint  Patient presents with  . Chest Pain  . Migraine    Kristina Ortiz is a 29 y.o. female.    Patient is a 22 who presents with URI symptoms.  She reports a 2 to 3-day history of runny nose congestion and coughing.  She has had fever up to 101 yesterday.  She has a bifrontal type headache.  She lost her smell but still has her taste.  She did have a recent Covid exposure with her cousin.  She denies any shortness of breath.  She has some pain in her right upper chest which she thinks is sore from blowing her nose so hard.  Is worse with movement.  Is not worse with breathing.  No leg pain or swelling.  No vomiting or diarrhea.  No abdominal pain.        Past Medical History:  Diagnosis Date  . Late prenatal care   . Medical history non-contributory   . Vaginal Pap smear, abnormal     Patient Active Problem List   Diagnosis Date Noted  . 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 Preeclampsia in postpartum period 10/09/2017  . Pregnancy 09/29/2017  . Abnormal glucose tolerance test (GTT) during pregnancy, antepartum 08/07/2017  . Supervision of normal first pregnancy, antepartum 04/25/2017  . Seasonal allergic rhinitis due to pollen 08/20/2016    Past Surgical History:  Procedure Laterality Date  . TONSILLECTOMY    . WISDOM TOOTH EXTRACTION       OB History    Gravida  1   Para  1   Term  1   Preterm      AB      Living  1     SAB      TAB      Ectopic      Multiple  0   Live Births  1           Family History  Problem Relation Age of Onset  . Hypertension Mother   . Hypertension Father   . COPD Paternal Aunt     Social History   Tobacco Use  . Smoking status: Never Smoker  . Smokeless tobacco: Never Used  Vaping Use  . Vaping Use: Never used  Substance Use Topics  . Alcohol use: Yes    Comment:  occ  . Drug use: No    Home Medications Prior to Admission medications   Not on File    Allergies    Cinnamon  Review of Systems   Review of Systems  Constitutional: Negative for chills, diaphoresis, fatigue and fever.  HENT: Positive for congestion, rhinorrhea and sneezing.   Eyes: Negative.   Respiratory: Positive for cough. Negative for chest tightness and shortness of breath.   Cardiovascular: Positive for chest pain. Negative for leg swelling.  Gastrointestinal: Negative for abdominal pain, blood in stool, diarrhea, nausea and vomiting.  Genitourinary: Negative for difficulty urinating, flank pain, frequency and hematuria.  Musculoskeletal: Negative for arthralgias and back pain.  Skin: Negative for rash.  Neurological: Positive for headaches. Negative for dizziness, speech difficulty, weakness and numbness.    Physical Exam Updated Vital Signs BP (!) 146/97 (BP Location: Left Arm)   Pulse 69   Temp 98.2 F (36.8 C) (Oral)   Resp 16   Ht 5\' 5"  (1.651 m)   Wt 97.5 kg  LMP 11/20/2019   SpO2 100%   BMI 35.78 kg/m   Physical Exam Constitutional:      Appearance: She is well-developed.  HENT:     Head: Normocephalic and atraumatic.     Mouth/Throat:     Mouth: Mucous membranes are moist.     Pharynx: No oropharyngeal exudate or posterior oropharyngeal erythema.  Eyes:     Pupils: Pupils are equal, round, and reactive to light.  Neck:     Comments: No meningismus Cardiovascular:     Rate and Rhythm: Normal rate and regular rhythm.     Heart sounds: Normal heart sounds.  Pulmonary:     Effort: Pulmonary effort is normal. No respiratory distress.     Breath sounds: Normal breath sounds. No wheezing or rales.  Chest:     Chest wall: Tenderness (Mild tenderness to the right upper chest) present.  Abdominal:     General: Bowel sounds are normal.     Palpations: Abdomen is soft.     Tenderness: There is no abdominal tenderness. There is no guarding or rebound.   Musculoskeletal:        General: Normal range of motion.     Cervical back: Normal range of motion and neck supple.  Lymphadenopathy:     Cervical: No cervical adenopathy.  Skin:    General: Skin is warm and dry.     Findings: No rash.  Neurological:     Mental Status: She is alert and oriented to person, place, and time.     ED Results / Procedures / Treatments   Labs (all labs ordered are listed, but only abnormal results are displayed) Labs Reviewed  RESPIRATORY PANEL BY RT PCR (FLU A&B, COVID)    EKG None  Radiology No results found.  Procedures Procedures (including critical care time)  Medications Ordered in ED Medications - No data to display  ED Course  I have reviewed the triage vital signs and the nursing notes.  Pertinent labs & imaging results that were available during my care of the patient were reviewed by me and considered in my medical decision making (see chart for details).    MDM Rules/Calculators/A&P                          Patient is a well-appearing 29 year old who presents with URI symptoms.  Her lungs are clear without suggestions of pneumonia.  She does have some reproducible tenderness to the right upper chest wall.  No crepitus or deformity.  This seems to be musculoskeletal in nature.  She does not have other symptoms that would be more concerning for ACS.  No shortness of breath or other symptoms that would be more concerning for pneumothorax.  No associated shortness of breath.  No hypoxia or tachycardia which would be more concerning for PE.  Her Covid test is negative.  She was discharged home in good condition.  Symptomatic care instructions were given.  Return precautions were given.   Final Clinical Impression(s) / ED Diagnoses Final diagnoses:  Viral URI    Rx / DC Orders ED Discharge Orders    None       Rolan Bucco, MD 11/29/19 1214

## 2020-06-22 ENCOUNTER — Encounter (HOSPITAL_COMMUNITY): Payer: Self-pay

## 2020-06-22 ENCOUNTER — Ambulatory Visit (HOSPITAL_COMMUNITY)
Admission: EM | Admit: 2020-06-22 | Discharge: 2020-06-22 | Disposition: A | Discharge status: Home / Self Care | Payer: Medicaid Other | Attending: Internal Medicine | Admitting: Internal Medicine

## 2020-06-22 ENCOUNTER — Other Ambulatory Visit: Payer: Self-pay

## 2020-06-22 DIAGNOSIS — K529 Noninfective gastroenteritis and colitis, unspecified: Secondary | ICD-10-CM

## 2020-06-22 MED ORDER — ONDANSETRON 4 MG PO TBDP: 4.0000 mg | ORAL_TABLET | Freq: Three times a day (TID) | ORAL | 0 refills | Status: AC | PRN

## 2020-06-22 NOTE — Discharge Instructions (Addendum)
Please increase oral fluid intake Please use Tylenol for headaches as opposed to using Advil because Advil is likely to irritate your stomach. If you have worsening symptoms please return to urgent care to be reevaluated.

## 2020-06-22 NOTE — ED Triage Notes (Signed)
Pt c/o vomiting, diarrhea, headaches and SOB x 3 days. Pt states she had nausea medicine from a long time ago and states she had no relief.

## 2020-06-25 NOTE — ED Provider Notes (Signed)
[facility]    CSN: [id number] Arrival date & time: 06/22/20  0818      History   Chief Complaint Chief Complaint  Patient presents with  . Vomiting  . Diarrhea  . Headache  . Shortness of Breath    HPI Kristina Ortiz is a 30 y.o. female comes to the urgent care with a 3-day history of headaches, nausea/vomiting and diarrhea.  Symptoms started insidiously and has been persistent.  Patient denies any fever or chills.  She has a cough with some shortness of breath.  No sick contacts. Skin inspection.  No erythema or increased is limited because of nausea and vomiting.  She has tried some antinausea medication with no improvement in her symptoms.  No unusual food intake.  No recent travel.Marland Kitchen   HPI  Past Medical History:  Diagnosis Date  . Late prenatal care   . Medical history non-contributory   . Vaginal Pap smear, abnormal     Patient Active Problem List   Diagnosis Date Noted  . 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 Preeclampsia in postpartum period 10/09/2017  . Pregnancy 09/29/2017  . Abnormal glucose tolerance test (GTT) during pregnancy, antepartum 08/07/2017  . Supervision of normal first pregnancy, antepartum 04/25/2017  . Seasonal allergic rhinitis due to pollen 08/20/2016    Past Surgical History:  Procedure Laterality Date  . TONSILLECTOMY    . WISDOM TOOTH EXTRACTION      OB History    Gravida  1   Para  1   Term  1   Preterm      AB      Living  1     SAB      IAB      Ectopic      Multiple  0   Live Births  1            Home Medications    Prior to Admission medications   Medication Sig Start Date End Date Taking? Authorizing Provider  ondansetron (ZOFRAN ODT) 4 MG disintegrating tablet Take 1 tablet (4 mg total) by mouth every 8 (eight) hours as needed for nausea or vomiting. 06/22/20  Yes Tinaya Ceballos, Britta Mccreedy, MD    Family History Family History  Problem Relation Age of Onset  . Hypertension Mother   .  Hypertension Father   . COPD Paternal Aunt     Social History Social History   Tobacco Use  . Smoking status: Never Smoker  . Smokeless tobacco: Never Used  Vaping Use  . Vaping Use: Never used  Substance Use Topics  . Alcohol use: Yes    Comment: occ  . Drug use: No     Allergies   Cinnamon   Review of Systems Review of Systems  Constitutional: Negative.   HENT: Negative.   Respiratory: Positive for cough.   Gastrointestinal: Positive for abdominal pain, diarrhea, nausea and vomiting.  Neurological: Positive for headaches.     Physical Exam Triage Vital Signs ED Triage Vitals  Enc Vitals Group     BP 06/22/20 0846 124/79     Pulse Rate 06/22/20 0845 65     Resp 06/22/20 0845 17     Temp 06/22/20 0845 98.2 F (36.8 C)     Temp Source 06/22/20 0845 Oral     SpO2 06/22/20 0845 99 %     Weight --      Height --      Head Circumference --      Peak  Flow --      Pain Score 06/22/20 0843 8     Pain Loc --      Pain Edu? --      Excl. in GC? --    No data found.  Updated Vital Signs BP 124/79 (BP Location: Right Arm)   Pulse 65   Temp 98.2 F (36.8 C) (Oral)   Resp 17   LMP 06/19/2020 (Exact Date)   SpO2 99%   Visual Acuity Right Eye Distance:   Left Eye Distance:   Bilateral Distance:    Right Eye Near:   Left Eye Near:    Bilateral Near:     Physical Exam Vitals and nursing note reviewed.  Constitutional:      General: She is not in acute distress.    Appearance: She is not ill-appearing.  Cardiovascular:     Rate and Rhythm: Normal rate and regular rhythm.     Heart sounds: Normal heart sounds.  Pulmonary:     Breath sounds: Normal breath sounds.  Abdominal:     General: Bowel sounds are normal.     Palpations: Abdomen is soft.  Neurological:     Mental Status: She is alert.     GCS: GCS eye subscore is 4. GCS verbal subscore is 5. GCS motor subscore is 6.      UC Treatments / Results  Labs (all labs ordered are listed, but  only abnormal results are displayed) Labs Reviewed - No data to display  EKG   Radiology No results found.  Procedures Procedures (including critical care time)  Medications Ordered in UC Medications - No data to display  Initial Impression / Assessment and Plan / UC Course  I have reviewed the triage vital signs and the nursing notes.  Pertinent labs & imaging results that were available during my care of the patient were reviewed by me and considered in my medical decision making (see chart for details).     1.  Acute gastroenteritis likely viral Patient is advised to stop taking Advil and rather use Tylenol Zofran as needed for nausea/vomiting control fluid intake Return precautions given Final Clinical Impressions(s) / UC Diagnoses   Final diagnoses:  Gastroenteritis, acute     Discharge Instructions     Please increase oral fluid intake Please use Tylenol for headaches as opposed to using Advil because Advil is likely to irritate your stomach. If you have worsening symptoms please return to urgent care to be reevaluated.    ED Prescriptions    Medication Sig Dispense Auth. Provider   ondansetron (ZOFRAN ODT) 4 MG disintegrating tablet Take 1 tablet (4 mg total) by mouth every 8 (eight) hours as needed for nausea or vomiting. 20 tablet Cilicia Borden, Britta Mccreedy, MD     PDMP not reviewed this encounter.   Merrilee Jansky, MD 06/25/20 1213

## 2020-07-03 ENCOUNTER — Other Ambulatory Visit: Payer: Self-pay

## 2020-07-03 ENCOUNTER — Ambulatory Visit (HOSPITAL_COMMUNITY)
Admission: EM | Admit: 2020-07-03 | Discharge: 2020-07-03 | Disposition: A | Discharge status: Home / Self Care | Payer: Medicaid Other | Attending: Physician Assistant | Admitting: Physician Assistant

## 2020-07-03 ENCOUNTER — Encounter (HOSPITAL_COMMUNITY): Payer: Self-pay

## 2020-07-03 DIAGNOSIS — U071 COVID-19: Secondary | ICD-10-CM | POA: Insufficient documentation

## 2020-07-03 DIAGNOSIS — J069 Acute upper respiratory infection, unspecified: Secondary | ICD-10-CM | POA: Diagnosis not present

## 2020-07-03 LAB — POC URINE PREG, ED: Preg Test, Ur: NEGATIVE

## 2020-07-03 MED ORDER — ACETAMINOPHEN 325 MG PO TABS
650.0000 mg | ORAL_TABLET | Freq: Once | ORAL | Status: AC
  Administered 2020-07-03: 650 mg via ORAL

## 2020-07-03 MED ORDER — ACETAMINOPHEN 325 MG PO TABS
ORAL_TABLET | ORAL | Status: AC
  Filled 2020-07-03: qty 2

## 2020-07-03 MED ORDER — PROMETHAZINE-DM 6.25-15 MG/5ML PO SYRP: 5.0000 mL | ORAL_SOLUTION | Freq: Four times a day (QID) | ORAL | 0 refills | Status: AC | PRN

## 2020-07-03 NOTE — ED Triage Notes (Signed)
Pt in with c/o dry cough, congestion, sob, fever tmax 102 and body aches that started yesterday   Pt has taken thera flu for sxs

## 2020-07-03 NOTE — Discharge Instructions (Addendum)
We will be in touch with your COVID results soon as we have them.I have called and Promethazine DM which is a cough medicine.  You should not drive or drink alcohol taking this as it will cause you to be drowsy.  Alternate Tylenol and ibuprofen as we discussed to manage pain and fever.  Drink plenty of fluids.  I recommend Mucinex and Flonase for additional symptom relief.  If anything worsens please return for reevaluation.

## 2020-07-03 NOTE — ED Provider Notes (Signed)
[facility]    CSN: [id number] Arrival date & time: 07/03/20  1418      History   Chief Complaint Chief Complaint  Patient presents with  . Generalized Body Aches  . Cough  . Fever  . Nasal Congestion  . Shortness of Breath    HPI Kristina Ortiz is a 30 y.o. female.   Patient presents today with a 2-day history of fever.  Reports associated nasal congestion, sore throat, headache, body aches, shortness of breath, diarrhea.  Denies any chest pain, vomiting, dizziness, syncope.  She has tried TheraFlu without improvement of symptoms.  Denies any known sick contacts.  She has not had influenza or COVID-19 vaccinations.  She does have a history of allergies managed with as needed Zyrtec.  She denies history of smoking, asthma, COPD.  She denies any recent antibiotic use.  She has missed work as a result of symptoms.     Past Medical History:  Diagnosis Date  . Late prenatal care   . Medical history non-contributory   . Vaginal Pap smear, abnormal     Patient Active Problem List   Diagnosis Date Noted  . 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 Preeclampsia in postpartum period 10/09/2017  . Pregnancy 09/29/2017  . Abnormal glucose tolerance test (GTT) during pregnancy, antepartum 08/07/2017  . Supervision of normal first pregnancy, antepartum 04/25/2017  . Seasonal allergic rhinitis due to pollen 08/20/2016    Past Surgical History:  Procedure Laterality Date  . TONSILLECTOMY    . WISDOM TOOTH EXTRACTION      OB History    Gravida  1   Para  1   Term  1   Preterm      AB      Living  1     SAB      IAB      Ectopic      Multiple  0   Live Births  1            Home Medications    Prior to Admission medications   Medication Sig Start Date End Date Taking? Authorizing Provider  promethazine-dextromethorphan (PROMETHAZINE-DM) 6.25-15 MG/5ML syrup Take 5 mLs by mouth 4 (four) times daily as needed for cough. 07/03/20  Yes Albertia Carvin  K, PA-C  ondansetron (ZOFRAN ODT) 4 MG disintegrating tablet Take 1 tablet (4 mg total) by mouth every 8 (eight) hours as needed for nausea or vomiting. 06/22/20   Lamptey, Britta Mccreedy, MD    Family History Family History  Problem Relation Age of Onset  . Hypertension Mother   . Hypertension Father   . COPD Paternal Aunt     Social History Social History   Tobacco Use  . Smoking status: Never Smoker  . Smokeless tobacco: Never Used  Vaping Use  . Vaping Use: Never used  Substance Use Topics  . Alcohol use: Yes    Comment: occ  . Drug use: No     Allergies   Cinnamon   Review of Systems Review of Systems  Constitutional: Positive for activity change, appetite change, fatigue and fever.  HENT: Positive for congestion and sore throat. Negative for sinus pressure and sneezing.   Respiratory: Positive for cough and shortness of breath.   Cardiovascular: Negative for chest pain.  Gastrointestinal: Positive for diarrhea. Negative for abdominal pain, nausea and vomiting.  Musculoskeletal: Positive for arthralgias and myalgias.  Neurological: Positive for headaches. Negative for dizziness and light-headedness.     Physical Exam Triage  Vital Signs ED Triage Vitals  Enc Vitals Group     BP 07/03/20 1617 128/80     Pulse Rate 07/03/20 1617 (!) 105     Resp 07/03/20 1617 19     Temp 07/03/20 1617 (!) 101.6 F (38.7 C)     Temp Source 07/03/20 1617 Oral     SpO2 07/03/20 1617 100 %     Weight --      Height --      Head Circumference --      Peak Flow --      Pain Score 07/03/20 1616 10     Pain Loc --      Pain Edu? --      Excl. in GC? --    No data found.  Updated Vital Signs BP 128/80 (BP Location: Right Arm)   Pulse (!) 105   Temp (!) 101.5 F (38.6 C) (Oral)   Resp 19   LMP 06/19/2020 (Exact Date)   SpO2 100%   Breastfeeding No   Visual Acuity Right Eye Distance:   Left Eye Distance:   Bilateral Distance:    Right Eye Near:   Left Eye Near:     Bilateral Near:     Physical Exam Vitals reviewed.  Constitutional:      General: She is awake. She is not in acute distress.    Appearance: Normal appearance. She is not ill-appearing.     Comments: Very pleasant female appears stated age in no acute distress  HENT:     Head: Normocephalic and atraumatic.     Right Ear: Tympanic membrane, ear canal and external ear normal. Tympanic membrane is not erythematous or bulging.     Left Ear: Tympanic membrane, ear canal and external ear normal. Tympanic membrane is not erythematous or bulging.     Nose:     Right Sinus: Maxillary sinus tenderness and frontal sinus tenderness present.     Left Sinus: Maxillary sinus tenderness and frontal sinus tenderness present.     Mouth/Throat:     Pharynx: Uvula midline. Posterior oropharyngeal erythema present. No oropharyngeal exudate.     Comments: Moderate erythema and drainage in posterior pharynx Cardiovascular:     Rate and Rhythm: Normal rate and regular rhythm.     Heart sounds: No murmur heard.   Pulmonary:     Effort: Pulmonary effort is normal.     Breath sounds: Normal breath sounds. No wheezing, rhonchi or rales.     Comments: Clear to auscultation bilaterally Lymphadenopathy:     Head:     Right side of head: No submental, submandibular or tonsillar adenopathy.     Left side of head: No submental, submandibular or tonsillar adenopathy.     Cervical: No cervical adenopathy.  Psychiatric:        Behavior: Behavior is cooperative.      UC Treatments / Results  Labs (all labs ordered are listed, but only abnormal results are displayed) Labs Reviewed  SARS CORONAVIRUS 2 (TAT 6-24 HRS)  POC URINE PREG, ED    EKG   Radiology No results found.  Procedures Procedures (including critical care time)  Medications Ordered in UC Medications  acetaminophen (TYLENOL) tablet 650 mg (650 mg Oral Given 07/03/20 1620)    Initial Impression / Assessment and Plan / UC Course  I  have reviewed the triage vital signs and the nursing notes.  Pertinent labs & imaging results that were available during my care of the patient were reviewed  by me and considered in my medical decision making (see chart for details).     Urine Pregnancy Was Negative in Office Today.  COVID testing obtained today-results pending.  Patient was started to remain in isolation until results are obtained.  She was given a work excuse note.  She was prescribed Promethazine DM for cough with instruction not to drive or drink alcohol with this medication as drowsiness is a common side effect.  Recommended she alternate Tylenol/ibuprofen for fever and pain and use Mucinex and Flonase for additional symptom relief.  Strict return precautions given to which patient expressed understanding.  Final Clinical Impressions(s) / UC Diagnoses   Final diagnoses:  Viral URI with cough     Discharge Instructions     We will be in touch with your COVID results soon as we have them.I have called and Promethazine DM which is a cough medicine.  You should not drive or drink alcohol taking this as it will cause you to be drowsy.  Alternate Tylenol and ibuprofen as we discussed to manage pain and fever.  Drink plenty of fluids.  I recommend Mucinex and Flonase for additional symptom relief.  If anything worsens please return for reevaluation.    ED Prescriptions    Medication Sig Dispense Auth. Provider   promethazine-dextromethorphan (PROMETHAZINE-DM) 6.25-15 MG/5ML syrup Take 5 mLs by mouth 4 (four) times daily as needed for cough. 118 mL Latiesha Harada K, PA-C     PDMP not reviewed this encounter.   Jeani Hawking, PA-C 07/03/20 1806

## 2020-07-04 ENCOUNTER — Other Ambulatory Visit: Payer: Self-pay | Admitting: Adult Health

## 2020-07-04 ENCOUNTER — Other Ambulatory Visit (HOSPITAL_COMMUNITY): Payer: Self-pay

## 2020-07-04 ENCOUNTER — Encounter: Payer: Self-pay | Admitting: Adult Health

## 2020-07-04 DIAGNOSIS — U071 COVID-19: Secondary | ICD-10-CM

## 2020-07-04 LAB — SARS CORONAVIRUS 2 (TAT 6-24 HRS): SARS Coronavirus 2: POSITIVE — AB

## 2020-07-04 MED ORDER — MOLNUPIRAVIR EUA 200MG CAPSULE
4.0000 | ORAL_CAPSULE | Freq: Two times a day (BID) | ORAL | 0 refills | Status: AC
  Filled 2020-07-04: qty 40, 5d supply, fill #0

## 2020-07-04 NOTE — Progress Notes (Signed)
Outpatient Oral COVID Treatment Note  I connected with Kristina Ortiz on 07/04/2020/11:08 AM by telephone and verified that I am speaking with the correct person using two identifiers.  I discussed the limitations, risks, security, and privacy concerns of performing an evaluation and management service by telephone and the availability of in person appointments. I also discussed with the patient that there may be a patient responsible charge related to this service. The patient expressed understanding and agreed to proceed.  Patient location: home Provider location: home Others participating in call: none  Diagnosis: COVID-19 infection  Purpose of visit: Discussion of potential use of Molnupiravir or Paxlovid, a new treatment for mild to moderate COVID-19 viral infection in non-hospitalized patients.   Subjective: Patient is a 30 y.o. female who has been diagnosed with COVID 19 viral infection.  Their symptoms began on 07/01/2020 with fever and cough.    Past Medical History:  Diagnosis Date  . Late prenatal care   . Medical history non-contributory   . Vaginal Pap smear, abnormal     Allergies  Allergen Reactions  . Cinnamon     Facial swelling     Current Outpatient Medications:  .  ondansetron (ZOFRAN ODT) 4 MG disintegrating tablet, Take 1 tablet (4 mg total) by mouth every 8 (eight) hours as needed for nausea or vomiting., Disp: 20 tablet, Rfl: 0 .  promethazine-dextromethorphan (PROMETHAZINE-DM) 6.25-15 MG/5ML syrup, Take 5 mLs by mouth 4 (four) times daily as needed for cough., Disp: 118 mL, Rfl: 0  Objective: Patient appears/sounds tired and hoarse.  They are in no apparent distress.  Breathing is non labored.  Mood and behavior are normal.  Laboratory Data:  Recent Results (from the past 2160 hour(s))  SARS CORONAVIRUS 2 (TAT 6-24 HRS) Nasopharyngeal Nasopharyngeal Swab     Status: Abnormal   Collection Time: 07/03/20  4:36 PM   Specimen: Nasopharyngeal Swab  Result Value  Ref Range   SARS Coronavirus 2 POSITIVE (A) NEGATIVE    Comment: (NOTE) SARS-CoV-2 target nucleic acids are DETECTED.  The SARS-CoV-2 RNA is generally detectable in upper and lower respiratory specimens during the acute phase of infection. Positive results are indicative of the presence of SARS-CoV-2 RNA. Clinical correlation with patient history and other diagnostic information is  necessary to determine patient infection status. Positive results do not rule out bacterial infection or co-infection with other viruses.  The expected result is Negative.  Fact Sheet for Patients: HairSlick.no  Fact Sheet for Healthcare Providers: quierodirigir.com  This test is not yet approved or cleared by the [place] FDA and  has been authorized for detection and/or diagnosis of SARS-CoV-2 by FDA under an Emergency Use Authorization (EUA). This EUA will remain  in effect (meaning this test can be used) for the duration of the COVID-19 declaration under Section 564(b)(1) of the Act, 21 U. S.C. section 360bbb-3(b)(1), unless the authorization is terminated or revoked sooner.   Performed at [facility], [address], [place], [place] [address]   POC urine pregnancy     Status: None   Collection Time: 07/03/20  4:45 PM  Result Value Ref Range   Preg Test, Ur NEGATIVE NEGATIVE    Comment:        THE SENSITIVITY OF THIS METHODOLOGY IS >24 mIU/mL      Assessment: 30 y.o. female with mild/moderate COVID 19 viral infection diagnosed on 07/03/2020 at high risk for progression to severe COVID 19.  Plan:  This patient is a 30  y.o. female that meets the following criteria for Emergency Use Authorization of: Molnupiravir  1. Age >18 yr 2. SARS-COV-2 positive test 3. Symptom onset < 5 days 4. Mild-to-moderate COVID disease with high risk for severe progression to hospitalization or death   I have spoken and communicated the  following to the patient or parent/caregiver regarding: 1. Molnupiravir is an unapproved drug that is authorized for use under an TEFL teacher.  2. There are no adequate, approved, available products for the treatment of COVID-19 in adults who have mild-to-moderate COVID-19 and are at high risk for progressing to severe COVID-19, including hospitalization or death. 3. Other therapeutics are currently authorized. For additional information on all products authorized for treatment or prevention of COVID-19, please see https://www.graham-miller.com/.  4. There are benefits and risks of taking this treatment as outlined in the "Fact Sheet for Patients and Caregivers."  5. "Fact Sheet for Patients and Caregivers" was reviewed with patient. A hard copy will be provided to patient from pharmacy prior to the patient receiving treatment. 6. Patients should continue to self-isolate and use infection control measures (e.g., wear mask, isolate, social distance, avoid sharing personal items, clean and disinfect "high touch" surfaces, and frequent handwashing) according to CDC guidelines.  7. The patient or parent/caregiver has the option to accept or refuse treatment. 8. [employer] has established a pregnancy surveillance program. 9. Females of childbearing potential should use a reliable method of contraception correctly and consistently, as applicable, for the duration of treatment and for 4 days after the last dose of Molnupiravir. 10. Males of reproductive potential who are sexually active with females of childbearing potential should use a reliable method of contraception correctly and consistently during treatment and for at least 3 months after the last dose. 11. Pregnancy status and risk was assessed. Patient verbalized understanding of precautions.   After reviewing above information with  the patient, the patient agrees to receive molnupiravir.  Follow up instructions:    . Take prescription BID x 5 days as directed . Reach out to pharmacist for counseling on medication if desired . For concerns regarding further COVID symptoms please follow up with your PCP or urgent care . For urgent or life-threatening issues, seek care at your local emergency department  The patient was provided an opportunity to ask questions, and all were answered. The patient agreed with the plan and demonstrated an understanding of the instructions.   Script sent to [facility] and opted to pick up RX.  The patient was advised to call their PCP or seek an in-person evaluation if the symptoms worsen or if the condition fails to improve as anticipated.   I provided 10 minutes of non face-to-face telephone visit time during this encounter, and > 50% was spent counseling as documented under my assessment & plan.  Noreene Filbert, NP 07/04/2020 /11:08 AM

## 2021-10-19 IMAGING — CR DG CHEST 2V
2 series · 2 of 2 positions shown · non-contrast
Comparison: February 25, 2016

CLINICAL DATA: Chest pain

EXAM:
CHEST - 2 VIEW

[w chest pa]
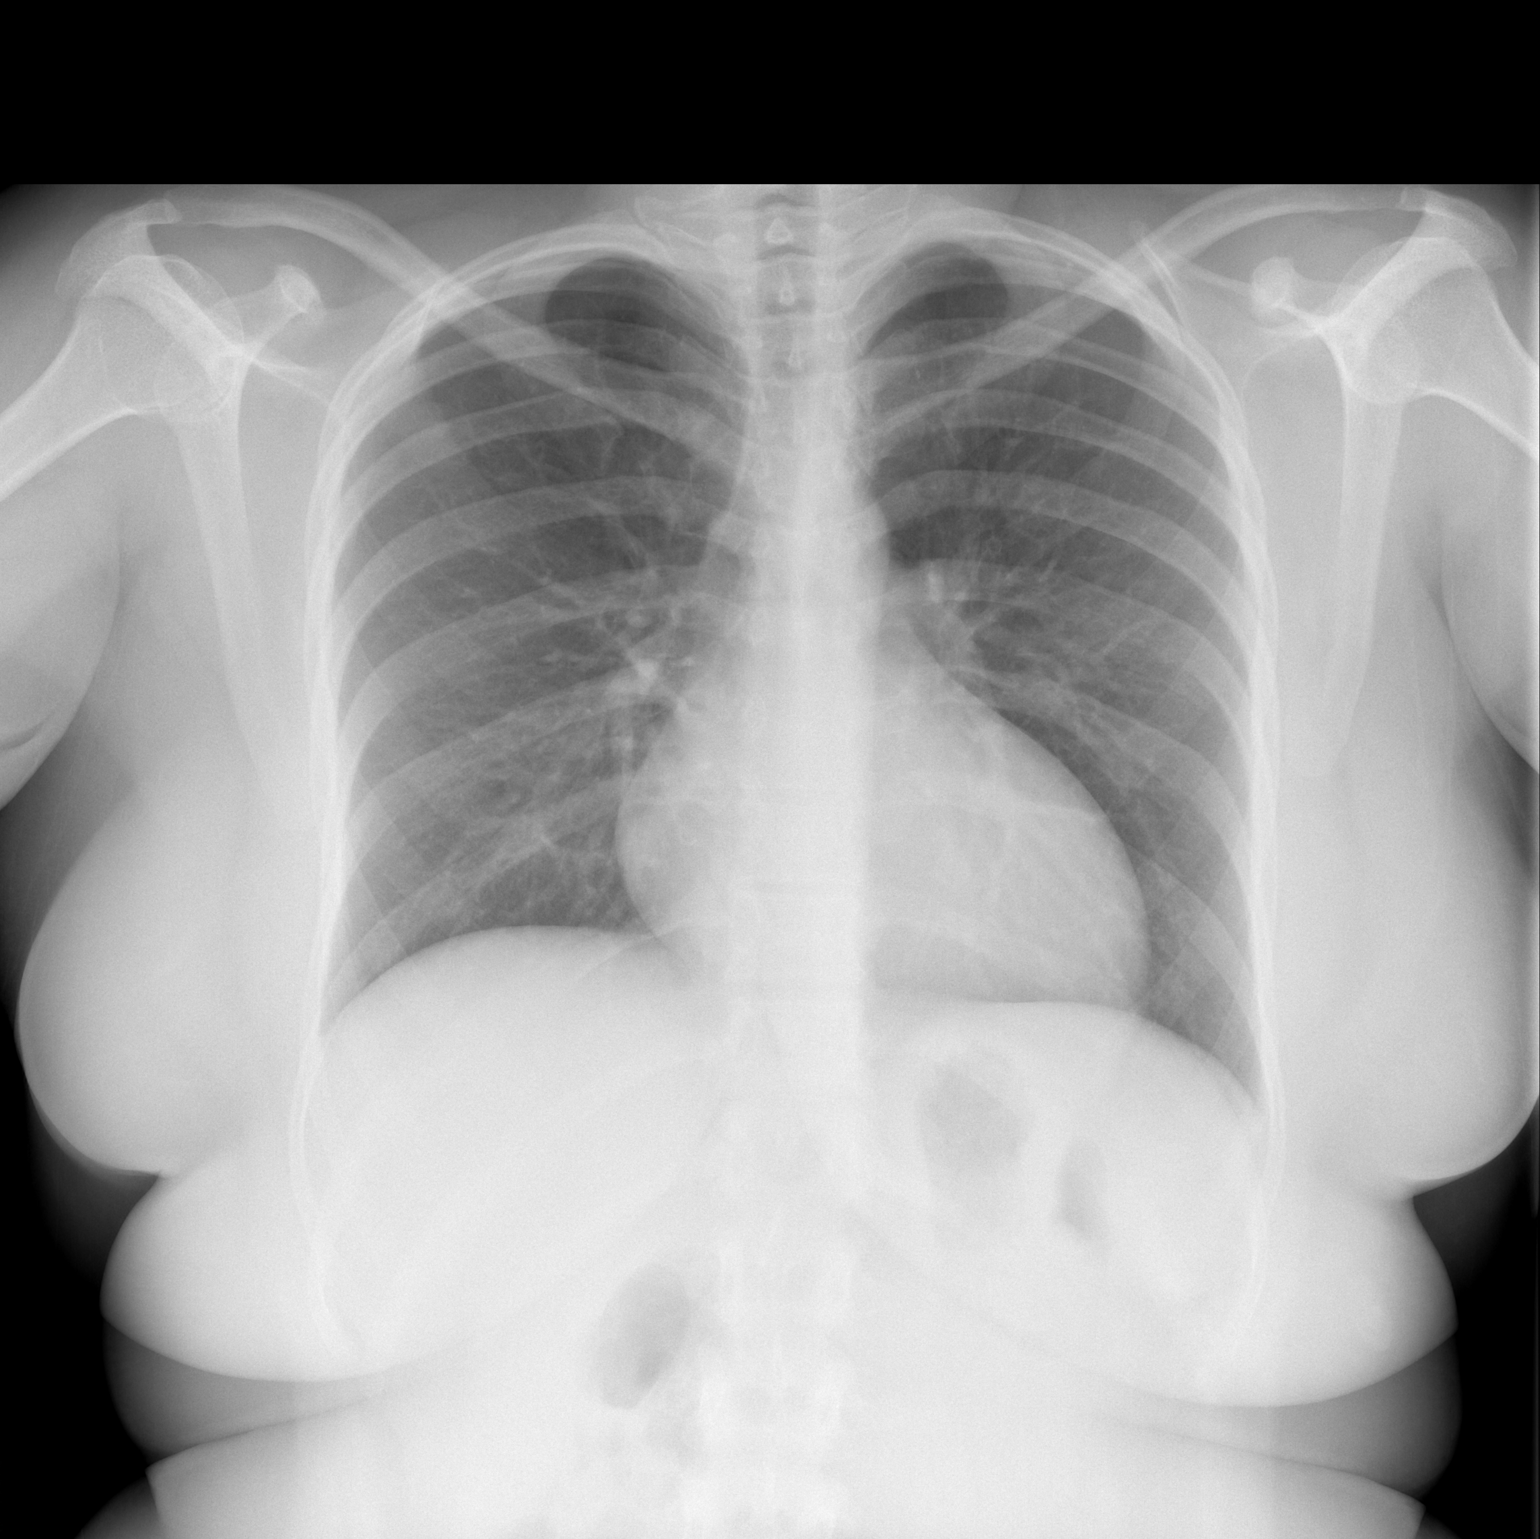

[w chest lat]
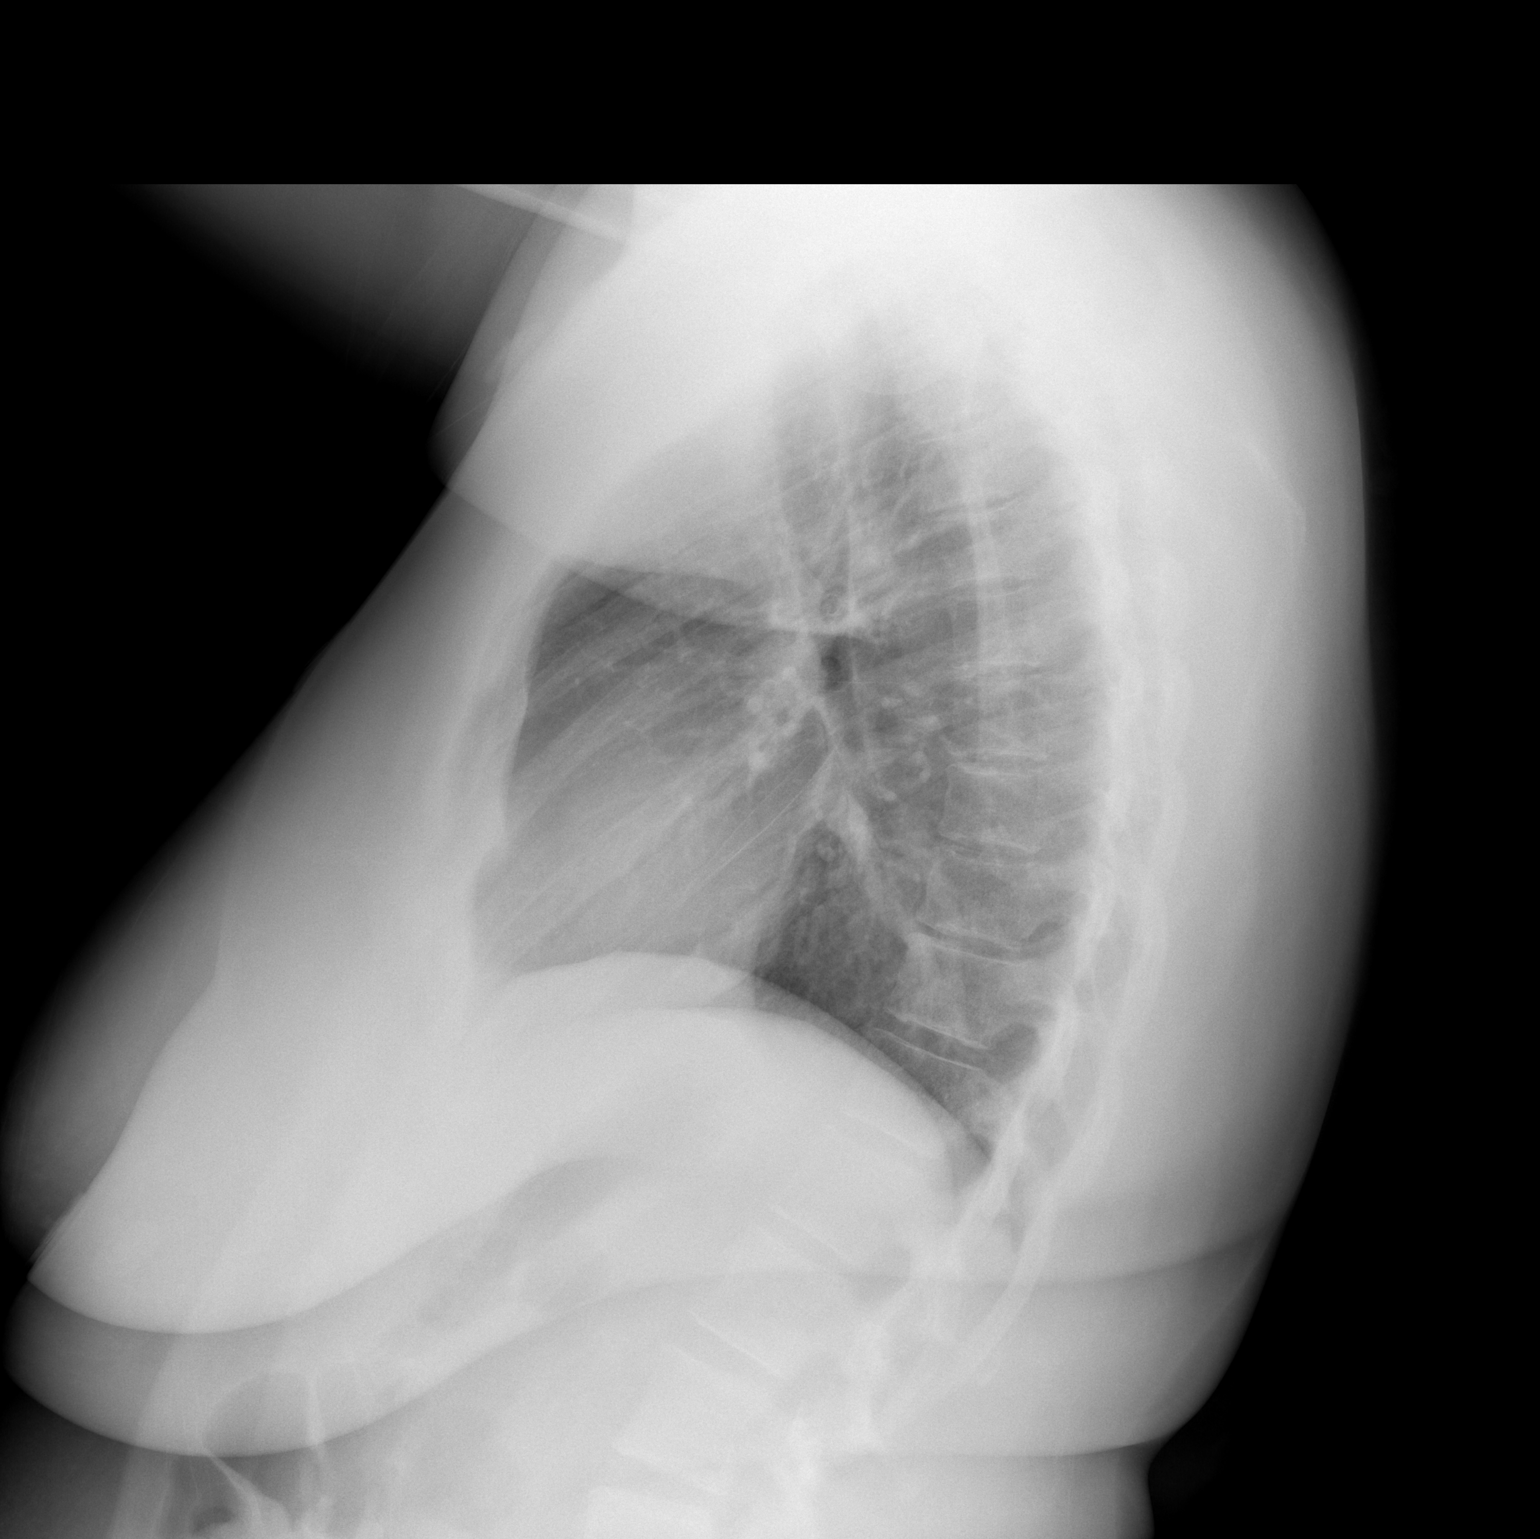

[2 of 2 positions shown; findings below may reference images not displayed]

FINDINGS: Lungs are clear. Heart size and pulmonary vascularity are normal. No
adenopathy. No pneumothorax. No bone lesions.
IMPRESSION: No abnormality noted.

## 2022-05-18 ENCOUNTER — Encounter (HOSPITAL_BASED_OUTPATIENT_CLINIC_OR_DEPARTMENT_OTHER): Payer: Self-pay | Admitting: Emergency Medicine

## 2022-05-18 ENCOUNTER — Emergency Department (HOSPITAL_BASED_OUTPATIENT_CLINIC_OR_DEPARTMENT_OTHER): Payer: BC Managed Care – PPO

## 2022-05-18 ENCOUNTER — Other Ambulatory Visit: Payer: Self-pay

## 2022-05-18 ENCOUNTER — Emergency Department (HOSPITAL_BASED_OUTPATIENT_CLINIC_OR_DEPARTMENT_OTHER)
Admission: EM | Admit: 2022-05-18 | Discharge: 2022-05-18 | Disposition: A | Discharge status: Home / Self Care | Payer: BC Managed Care – PPO | Attending: Emergency Medicine | Admitting: Emergency Medicine

## 2022-05-18 DIAGNOSIS — R109 Unspecified abdominal pain: Secondary | ICD-10-CM | POA: Diagnosis present

## 2022-05-18 DIAGNOSIS — E871 Hypo-osmolality and hyponatremia: Secondary | ICD-10-CM | POA: Diagnosis not present

## 2022-05-18 DIAGNOSIS — E876 Hypokalemia: Secondary | ICD-10-CM | POA: Insufficient documentation

## 2022-05-18 DIAGNOSIS — N12 Tubulo-interstitial nephritis, not specified as acute or chronic: Secondary | ICD-10-CM | POA: Insufficient documentation

## 2022-05-18 LAB — URINALYSIS, W/ REFLEX TO CULTURE (INFECTION SUSPECTED)
Glucose, UA: NEGATIVE mg/dL
Ketones, ur: NEGATIVE mg/dL
Nitrite: POSITIVE — AB
Protein, ur: >=300 mg/dL — AB
RBC / HPF: >50 RBC/hpf (ref 0–5)
Specific Gravity, Urine: 1.025 (ref 1.005–1.030)
pH: 5.5 (ref 5.0–8.0)

## 2022-05-18 LAB — CBC WITH DIFFERENTIAL/PLATELET
Abs Immature Granulocytes: 0.04 10*3/uL (ref 0.00–0.07)
Basophils Absolute: 0 10*3/uL (ref 0.0–0.1)
Basophils Relative: 0 %
Eosinophils Absolute: 0 10*3/uL (ref 0.0–0.5)
Eosinophils Relative: 0 %
HCT: 37.4 % (ref 36.0–46.0)
Hemoglobin: 12.6 g/dL (ref 12.0–15.0)
Immature Granulocytes: 1 %
Lymphocytes Relative: 9 %
Lymphs Abs: 0.7 10*3/uL (ref 0.7–4.0)
MCH: 28.8 pg (ref 26.0–34.0)
MCHC: 33.7 g/dL (ref 30.0–36.0)
MCV: 85.4 fL (ref 80.0–100.0)
Monocytes Absolute: 0.8 10*3/uL (ref 0.1–1.0)
Monocytes Relative: 11 %
Neutro Abs: 6.4 10*3/uL (ref 1.7–7.7)
Neutrophils Relative %: 79 %
Platelets: 182 10*3/uL (ref 150–400)
RBC: 4.38 MIL/uL (ref 3.87–5.11)
RDW: 13.7 % (ref 11.5–15.5)
WBC: 7.9 10*3/uL (ref 4.0–10.5)
nRBC: 0 % (ref 0.0–0.2)

## 2022-05-18 LAB — COMPREHENSIVE METABOLIC PANEL
ALT: 20 U/L (ref 0–44)
AST: 25 U/L (ref 15–41)
Albumin: 3.7 g/dL (ref 3.5–5.0)
Alkaline Phosphatase: 49 U/L (ref 38–126)
Anion gap: 8 (ref 5–15)
BUN: 11 mg/dL (ref 6–20)
CO2: 22 mmol/L (ref 22–32)
Calcium: 8.3 mg/dL — ABNORMAL LOW (ref 8.9–10.3)
Chloride: 102 mmol/L (ref 98–111)
Creatinine, Ser: 1.05 mg/dL — ABNORMAL HIGH (ref 0.44–1.00)
GFR, Estimated: >60 mL/min (ref >60)
Glucose, Bld: 152 mg/dL — ABNORMAL HIGH (ref 70–99)
Potassium: 2.8 mmol/L — ABNORMAL LOW (ref 3.5–5.1)
Sodium: 132 mmol/L — ABNORMAL LOW (ref 135–145)
Total Bilirubin: 0.9 mg/dL (ref 0.3–1.2)
Total Protein: 8.1 g/dL (ref 6.5–8.1)

## 2022-05-18 LAB — PREGNANCY, URINE: Preg Test, Ur: NEGATIVE

## 2022-05-18 LAB — MAGNESIUM: Magnesium: 1.9 mg/dL (ref 1.7–2.4)

## 2022-05-18 LAB — LIPASE, BLOOD: Lipase: 21 U/L (ref 11–51)

## 2022-05-18 MED ORDER — IOHEXOL 300 MG/ML  SOLN
80.0000 mL | Freq: Once | INTRAMUSCULAR | Status: AC | PRN
  Administered 2022-05-18: 80 mL via INTRAVENOUS

## 2022-05-18 MED ORDER — SODIUM CHLORIDE 0.9 % IV BOLUS
500.0000 mL | Freq: Once | INTRAVENOUS | Status: AC
  Administered 2022-05-18: 500 mL via INTRAVENOUS

## 2022-05-18 MED ORDER — SODIUM CHLORIDE 0.9 % IV SOLN
1.0000 g | Freq: Once | INTRAVENOUS | Status: AC
  Administered 2022-05-18: 1 g via INTRAVENOUS
  Filled 2022-05-18: qty 10

## 2022-05-18 MED ORDER — POTASSIUM CHLORIDE CRYS ER 20 MEQ PO TBCR: 20.0000 meq | EXTENDED_RELEASE_TABLET | Freq: Every day | ORAL | 0 refills | Status: DC

## 2022-05-18 MED ORDER — POTASSIUM CHLORIDE CRYS ER 20 MEQ PO TBCR
40.0000 meq | EXTENDED_RELEASE_TABLET | Freq: Once | ORAL | Status: AC
  Administered 2022-05-18: 40 meq via ORAL
  Filled 2022-05-18: qty 2

## 2022-05-18 MED ORDER — CEFDINIR 300 MG PO CAPS: 300.0000 mg | ORAL_CAPSULE | Freq: Two times a day (BID) | ORAL | 0 refills | Status: DC

## 2022-05-18 MED ORDER — POTASSIUM CHLORIDE CRYS ER 20 MEQ PO TBCR: 20.0000 meq | EXTENDED_RELEASE_TABLET | Freq: Every day | ORAL | 0 refills | Status: AC

## 2022-05-18 MED ORDER — ONDANSETRON HCL 4 MG/2ML IJ SOLN
4.0000 mg | Freq: Once | INTRAMUSCULAR | Status: AC
  Administered 2022-05-18: 4 mg via INTRAVENOUS
  Filled 2022-05-18: qty 2

## 2022-05-18 MED ORDER — CEFDINIR 300 MG PO CAPS: 300.0000 mg | ORAL_CAPSULE | Freq: Two times a day (BID) | ORAL | 0 refills | Status: AC

## 2022-05-18 MED ORDER — SODIUM CHLORIDE 0.9 % IV BOLUS
1000.0000 mL | Freq: Once | INTRAVENOUS | Status: AC
  Administered 2022-05-18: 1000 mL via INTRAVENOUS

## 2022-05-18 MED ORDER — MORPHINE SULFATE (PF) 4 MG/ML IV SOLN
4.0000 mg | Freq: Once | INTRAVENOUS | Status: AC
  Administered 2022-05-18: 4 mg via INTRAVENOUS
  Filled 2022-05-18: qty 1

## 2022-05-18 MED ORDER — POTASSIUM CHLORIDE 10 MEQ/100ML IV SOLN
10.0000 meq | Freq: Once | INTRAVENOUS | Status: AC
  Administered 2022-05-18: 10 meq via INTRAVENOUS
  Filled 2022-05-18: qty 100

## 2022-05-18 NOTE — ED Provider Notes (Signed)
[facility] Provider Note   CSN: [id number] Arrival date & time: 05/18/22  1030     History  Chief Complaint  Patient presents with   Abdominal Pain    Kristina Ortiz is a 32 y.o. female.   Abdominal Pain   32 year old female presents emergency department with complaints of abdominal pain.  Patient states that she has been with abdominal pain since Monday of this week.  Reports right-sided abdominal pain with radiation to her right back.  Was also with some symptoms of bodyaches, fever, chills, seen in urgent care on Wednesday with negative testing for COVID/flu.  Patient states that abdominal pain persisted.  Reports urinary frequency beginning on Wednesday of which is still present.  Was seen at urgent care yesterday with noted urinary tract infection accompanied flank pain and fever and treated for acute pyelonephritis.  She was told to come to the emergency department for further workup of right lower abdominal pain.  Patient currently still complaining of right-sided abdominal pain, right flank pain and urinary frequency.  States she has been unable to pick up antibiotics at pharmacy prescribed from yesterday.  Reports fever yesterday but has been afebrile today without antipyretic.  She also reports nausea and vomiting that is been present over the past few days.  Denies chest pain, shortness of breath, hematemesis, vaginal symptoms.  States the last bowel movement was greater than 7 days ago.  Patient states that she is currently on her menstrual cycle.  No significant pertinent past medical history.  Home Medications Prior to Admission medications   Medication Sig Start Date End Date Taking? Authorizing Provider  cefdinir (OMNICEF) 300 MG capsule Take 1 capsule (300 mg total) by mouth 2 (two) times daily. 05/18/22   Dion Saucier A, PA  ondansetron (ZOFRAN ODT) 4 MG disintegrating tablet Take 1 tablet (4 mg total) by mouth every 8  (eight) hours as needed for nausea or vomiting. 06/22/20   Lamptey, Myrene Galas, MD  potassium chloride SA (KLOR-CON M) 20 MEQ tablet Take 1 tablet (20 mEq total) by mouth daily. 05/19/22   Wilnette Kales, PA  promethazine-dextromethorphan (PROMETHAZINE-DM) 6.25-15 MG/5ML syrup Take 5 mLs by mouth 4 (four) times daily as needed for cough. 07/03/20   Raspet, Derry Skill, PA-C      Allergies    Cinnamon    Review of Systems   Review of Systems  Gastrointestinal:  Positive for abdominal pain.  All other systems reviewed and are negative.   Physical Exam Updated Vital Signs BP 135/75   Pulse 84   Temp 98.7 F (37.1 C) (Oral)   Resp 15   Ht 5\' 6"  (1.676 m)   Wt 86.2 kg   LMP 05/18/2022 (Exact Date)   SpO2 97%   BMI 30.67 kg/m  Physical Exam Vitals and nursing note reviewed.  Constitutional:      General: She is not in acute distress.    Appearance: She is well-developed.  HENT:     Head: Normocephalic and atraumatic.  Eyes:     Conjunctiva/sclera: Conjunctivae normal.  Cardiovascular:     Rate and Rhythm: Normal rate and regular rhythm.     Heart sounds: No murmur heard. Pulmonary:     Effort: Pulmonary effort is normal. No respiratory distress.     Breath sounds: Normal breath sounds.  Abdominal:     Palpations: Abdomen is soft.     Tenderness: There is abdominal tenderness in the right lower quadrant.  There is right CVA tenderness and guarding. There is no left CVA tenderness or rebound.  Musculoskeletal:        General: No swelling.     Cervical back: Neck supple.  Skin:    General: Skin is warm and dry.     Capillary Refill: Capillary refill takes less than 2 seconds.  Neurological:     Mental Status: She is alert.  Psychiatric:        Mood and Affect: Mood normal.     ED Results / Procedures / Treatments   Labs (all labs ordered are listed, but only abnormal results are displayed) Labs Reviewed  COMPREHENSIVE METABOLIC PANEL - Abnormal; Notable for the following  components:      Result Value   Sodium 132 (*)    Potassium 2.8 (*)    Glucose, Bld 152 (*)    Creatinine, Ser 1.05 (*)    Calcium 8.3 (*)    All other components within normal limits  URINALYSIS, W/ REFLEX TO CULTURE (INFECTION SUSPECTED) - Abnormal; Notable for the following components:   Color, Urine ORANGE (*)    APPearance HAZY (*)    Hgb urine dipstick LARGE (*)    Bilirubin Urine SMALL (*)    Protein, ur >=300 (*)    Nitrite POSITIVE (*)    Leukocytes,Ua TRACE (*)    Bacteria, UA FEW (*)    All other components within normal limits  LIPASE, BLOOD  CBC WITH DIFFERENTIAL/PLATELET  PREGNANCY, URINE  MAGNESIUM    EKG None  Radiology CT Abdomen Pelvis W Contrast  Result Date: 05/18/2022 CLINICAL DATA:  Right lower quadrant abdominal pain. EXAM: CT ABDOMEN AND PELVIS WITH CONTRAST TECHNIQUE: Multidetector CT imaging of the abdomen and pelvis was performed using the standard protocol following bolus administration of intravenous contrast. RADIATION DOSE REDUCTION: This exam was performed according to the departmental dose-optimization program which includes automated exposure control, adjustment of the mA and/or kV according to patient size and/or use of iterative reconstruction technique. CONTRAST:  19mL OMNIPAQUE IOHEXOL 300 MG/ML  SOLN COMPARISON:  08/29/2015 FINDINGS: Lower chest: Subsegmental atelectasis in both dependent lower lobes. Normal heart size without pericardial or pleural effusion. Hepatobiliary: Hepatomegaly at 19.2 cm craniocaudal. Normal gallbladder, without biliary ductal dilatation. Pancreas: Normal, without mass or ductal dilatation. Spleen: Normal in size, without focal abnormality. Adrenals/Urinary Tract: Normal adrenal glands. 2 mm interpolar left renal collecting system calculus. 3 mm lower pole right renal collecting system calculus. Heterogeneous enhancement throughout the right kidney, for example on images 23 and 25 of series 2. No hydronephrosis. Normal  urinary bladder. Stomach/Bowel: Normal stomach, without wall thickening. Normal colon, appendix, and terminal ileum. Normal small bowel. Vascular/Lymphatic: Normal caliber of the aorta and branch vessels. Circumaortic left renal vein. No abdominopelvic adenopathy. Reproductive: Intrauterine device.  No adnexal mass. Other: Trace free pelvic fluid is likely physiologic. No free intraperitoneal air. Musculoskeletal: No acute osseous abnormality. IMPRESSION: 1. Heterogeneous right renal enhancement, most consistent with pyelonephritis. 2. Bilateral nonobstructive nephrolithiasis. 3. Normal appendix. 4. Hepatomegaly Electronically Signed   By: Abigail Miyamoto M.D.   On: 05/18/2022 12:05    Procedures Procedures    Medications Ordered in ED Medications  sodium chloride 0.9 % bolus 1,000 mL (0 mLs Intravenous Stopped 05/18/22 1324)  ondansetron (ZOFRAN) injection 4 mg (4 mg Intravenous Given 05/18/22 1131)  morphine (PF) 4 MG/ML injection 4 mg (4 mg Intravenous Given 05/18/22 1136)  potassium chloride SA (KLOR-CON M) CR tablet 40 mEq (40 mEq Oral Given 05/18/22  1136)  potassium chloride 10 mEq in 100 mL IVPB (0 mEq Intravenous Stopped 05/18/22 1324)  iohexol (OMNIPAQUE) 300 MG/ML solution 80 mL (80 mLs Intravenous Contrast Given 05/18/22 1141)  cefTRIAXone (ROCEPHIN) 1 g in sodium chloride 0.9 % 100 mL IVPB (1 g Intravenous New Bag/Given 05/18/22 1317)  sodium chloride 0.9 % bolus 500 mL (500 mLs Intravenous New Bag/Given 05/18/22 1317)    ED Course/ Medical Decision Making/ A&P                             Medical Decision Making Amount and/or Complexity of Data Reviewed Labs: ordered.   This patient presents to the ED for concern of abdominal pain, this involves an extensive number of treatment options, and is a complaint that carries with it a high risk of complications and morbidity.  The differential diagnosis includes pancreatitis, PUD, cholecystitis, CBD pathology, hepatitis, volvulus, SBO/LBO,  diverticulitis, appendicitis, ectopic pregnancy, ovarian torsion, PID, tubo-ovarian abscess, mesenteric ischemia, aortic dissection, AAA, nephrolithiasis, pyelonephritis, cystitis   Co morbidities that complicate the patient evaluation  See HPI   Additional history obtained:  Additional history obtained from EMR External records from outside source obtained and reviewed including hospital records   Lab Tests:  I Ordered, and personally interpreted labs.  The pertinent results include: No leukocytosis noted.  No evidence of anemia.  Platelets within normal range.  Patient with hyponatremia, hypokalemia and hypocalcemia of 132, 2.8 and 8.3 respectively; patient treated with 1 L of normal saline, intravenous and oral potassium chloride.  Patient mild elevation in creatinine 1.05 of which supplemented via IV fluids.  Magnesium 1.9.  Lipase within normal limits.  Urine pregnancy negative.  UA few bacteria, greater than 50 RBC, 6-10 WBC, positive nitrite, trace leukocyte, greater than 300 protein, large hemoglobin, small bilirubin   Imaging Studies ordered:  I ordered imaging studies including CT abdomen pelvis I independently visualized and interpreted imaging which showed heterogeneous right renal enhancement most consistent with pyelonephritis.  Bilateral nonobstructing nephrolithiasis.  Normal appendix.  Hepatomegaly. I agree with the radiologist interpretation  Cardiac Monitoring: / EKG:  The patient was maintained on a cardiac monitor.  I personally viewed and interpreted the cardiac monitored which showed an underlying rhythm of: Sinus rhythm   Consultations Obtained:  N/a   Problem List / ED Course / Critical interventions / Medication management  Pyelonephritis I ordered medication including morphine, potassium chloride, Zofran, Rocephin, 1500 cc of normal saline   Reevaluation of the patient after these medicines showed that the patient improved I have reviewed the  patients home medicines and have made adjustments as needed   Social Determinants of Health:  Denies tobacco or illicit drug use.   Test / Admission - Considered:  Pyelonephritis Vitals signs within normal range and stable throughout visit. Laboratory/imaging studies significant for: See above 32 year old female presents emergency department with complaints of right flank pain along with urinary symptoms.  Patient found today to be with pyelonephritis.  Patient treated with 1 g of Rocephin while in the emergency department for broad-spectrum antibiotic coverage with urine culture pending.  Patient be sent home with cefdinir.  Patient overall well-appearing, afebrile with no leukocytosis.  Patient recommended follow-up with primary care within 2 to 3 days for reassessment of symptoms.  Treatment plan discussed at length with patient and she acknowledged understanding was agreeable to said plan. Worrisome signs and symptoms were discussed with the patient, and the patient acknowledged understanding to  return to the ED if noticed. Patient was stable upon discharge.          Final Clinical Impression(s) / ED Diagnoses Final diagnoses:  Pyelonephritis  Hypokalemia    Rx / DC Orders ED Discharge Orders          Ordered    potassium chloride SA (KLOR-CON M) 20 MEQ tablet  Daily,   Status:  Discontinued        05/18/22 1222    potassium chloride SA (KLOR-CON M) 20 MEQ tablet  Daily        05/18/22 1349    cefdinir (OMNICEF) 300 MG capsule  2 times daily,   Status:  Discontinued        05/18/22 1220    cefdinir (OMNICEF) 300 MG capsule  2 times daily        05/18/22 1349              Wilnette Kales, [place] 05/18/22 1357    Ezequiel Essex, MD 05/18/22 [phone]

## 2022-05-18 NOTE — ED Triage Notes (Signed)
Patient c/o right lower quadrant abdominal pain onset Monday. Pt was seen at urgent care in [place] and was told she needs an ultrasound.

## 2022-05-18 NOTE — Discharge Instructions (Addendum)
Note the workup today was overall consistent with pyelonephritis or infection of your kidney.  Will treat this with antibiotics in the form of cefdinir to take twice daily for the next 2 weeks.  Your potassium was also low today.  Will treat this with oral potassium for the next few days outpatient along with antibiotics.  Recommend reevaluation by primary care for reassessment of your symptoms.  Please do not hesitate to return to emergency department if the worrisome signs and symptoms we discussed become apparent.
